# Patient Record
Sex: Male | Born: 1940
Health system: Southern US, Community
[De-identification: ages and names within clinical notes are randomized; demographics above are authoritative.]

## PROBLEM LIST (undated history)

## (undated) DIAGNOSIS — E7800 Pure hypercholesterolemia, unspecified: Secondary | ICD-10-CM

## (undated) DIAGNOSIS — K219 Gastro-esophageal reflux disease without esophagitis: Secondary | ICD-10-CM

## (undated) DIAGNOSIS — I209 Angina pectoris, unspecified: Secondary | ICD-10-CM

## (undated) DIAGNOSIS — R0602 Shortness of breath: Secondary | ICD-10-CM

## (undated) DIAGNOSIS — C61 Malignant neoplasm of prostate: Secondary | ICD-10-CM

## (undated) DIAGNOSIS — I714 Abdominal aortic aneurysm, without rupture: Secondary | ICD-10-CM

## (undated) DIAGNOSIS — I10 Essential (primary) hypertension: Secondary | ICD-10-CM

## (undated) DIAGNOSIS — I7143 Infrarenal abdominal aortic aneurysm, without rupture: Secondary | ICD-10-CM

## (undated) HISTORY — PX: PROSTATECTOMY: SHX69

## (undated) HISTORY — PX: PENILE PROSTHESIS IMPLANT: SHX240

---

## 1992-11-25 DIAGNOSIS — C61 Malignant neoplasm of prostate: Secondary | ICD-10-CM

## 1992-11-25 HISTORY — DX: Malignant neoplasm of prostate: C61

## 1997-11-25 HISTORY — PX: HERNIA REPAIR: SHX51

## 1998-11-22 ENCOUNTER — Ambulatory Visit (HOSPITAL_BASED_OUTPATIENT_CLINIC_OR_DEPARTMENT_OTHER): Admission: RE | Admit: 1998-11-22 | Discharge: 1998-11-22 | Payer: Self-pay | Admitting: *Deleted

## 2000-02-15 ENCOUNTER — Ambulatory Visit (HOSPITAL_COMMUNITY): Admission: RE | Admit: 2000-02-15 | Discharge: 2000-02-15 | Payer: Self-pay | Admitting: Urology

## 2000-02-15 ENCOUNTER — Encounter: Payer: Self-pay | Admitting: Urology

## 2000-08-20 ENCOUNTER — Encounter: Payer: Self-pay | Admitting: Urology

## 2000-08-20 ENCOUNTER — Ambulatory Visit (HOSPITAL_COMMUNITY): Admission: RE | Admit: 2000-08-20 | Discharge: 2000-08-20 | Payer: Self-pay | Admitting: Urology

## 2008-05-04 ENCOUNTER — Encounter: Admission: RE | Admit: 2008-05-04 | Discharge: 2008-05-04 | Payer: Self-pay | Admitting: Family Medicine

## 2009-04-21 ENCOUNTER — Encounter: Admission: RE | Admit: 2009-04-21 | Discharge: 2009-04-21 | Payer: Self-pay | Admitting: Family Medicine

## 2009-05-25 ENCOUNTER — Ambulatory Visit: Payer: Self-pay | Admitting: *Deleted

## 2009-09-15 ENCOUNTER — Emergency Department (HOSPITAL_COMMUNITY): Admission: EM | Admit: 2009-09-15 | Discharge: 2009-09-15 | Payer: Self-pay | Admitting: Emergency Medicine

## 2011-01-16 ENCOUNTER — Other Ambulatory Visit: Payer: Self-pay | Admitting: Family Medicine

## 2011-01-16 DIAGNOSIS — M545 Low back pain, unspecified: Secondary | ICD-10-CM

## 2011-01-22 ENCOUNTER — Ambulatory Visit
Admission: RE | Admit: 2011-01-22 | Discharge: 2011-01-22 | Disposition: A | Discharge status: Home / Self Care | Payer: Medicare Other | Source: Ambulatory Visit | Attending: Family Medicine | Admitting: Family Medicine

## 2011-01-22 DIAGNOSIS — M545 Low back pain: Secondary | ICD-10-CM

## 2011-01-29 ENCOUNTER — Other Ambulatory Visit: Payer: Self-pay | Admitting: Neurosurgery

## 2011-01-29 DIAGNOSIS — R202 Paresthesia of skin: Secondary | ICD-10-CM

## 2011-02-04 ENCOUNTER — Ambulatory Visit
Admission: RE | Admit: 2011-02-04 | Discharge: 2011-02-04 | Disposition: A | Discharge status: Home / Self Care | Payer: Medicare Other | Source: Ambulatory Visit | Attending: Neurosurgery | Admitting: Neurosurgery

## 2011-02-04 DIAGNOSIS — R202 Paresthesia of skin: Secondary | ICD-10-CM

## 2011-04-09 NOTE — Consult Note (Signed)
VASCULAR SURGERY CONSULTATION   Reimann, Kahiau R  DOB:  1941-07-29                                       05/25/2009  ZOXWR#:[id number]   REFERRAL DIAGNOSIS:  A 3 cm abdominal aortic aneurysm.   HISTORY:  The patient is a 70 year old African American male in  reasonably good health.  He underwent an abdominal ultrasound on May 28  of this year for an evaluation of elevated liver function tests.  Incidental finding was a 3 cm infrarenal abdominal aorta.  This was  interpreted as a small abdominal aortic aneurysm.   The patient has had no abdominal pain.  Denies significant back pain.   Risk factors for aneurysm disease include hypertension and male sex.   PAST MEDICAL HISTORY:  1. Hypertension.  2. Prostate CA status post prostatectomy in 1995.   MEDICATIONS:  1. Oxybutynin one tablet daily.  2. Doxazosin 1/2 tablet daily.  3. Diclofenac 1 tablet b.i.d.  4. Aspirin 325 mg daily.   ALLERGIES:  None known.   SOCIAL HISTORY:  The patient is married.  He has two grown children.  His 98 year old son has diabetes.  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  Does not smoke or use tobacco products.  He will have the  occasional alcoholic beverage.   FAMILY HISTORY:  The patient notes his mother died of cancer at age 69.  Father died at age 30 of natural causes.  He has a sister deceased age  39 of an MI.  Another brother deceased age 61 with a history of cancer.   REVIEW OF SYSTEMS:  Refer to patient encounter form.  The patient's only  complaints relate to GI reflux.  He does have some urinary frequency.  Discomfort in his legs with walking.   PHYSICAL EXAM:  A 70 year old [place] American male alert and oriented.  Clearly obese.  Height is 5 feet 4 inches, 240 pounds.  No acute  distress.  Vital signs:  BP 136/82, pulse 72 per minute, respirations 16  per minute.  HEENT:  Mouth and throat are clear.  Normocephalic.  Extraocular movements intact.   Neck:  Supple.  No thyromegaly or  adenopathy.  Chest:  Equal entry bilaterally without rales or rhonchi.  Cardiovascular:  No carotid bruits.  Normal heart sounds without  murmurs.  No gallops or rubs.  Regular rate and rhythm.  Abdomen:  Obese.  Soft and nontender.  No organomegaly or masses felt.  Normal  bowel sounds without bruits.  Lower extremities:  Full range of motion.  2+ femoral and popliteal pulses bilaterally.  Intact posterior tibial  pulses bilaterally.  2+ right dorsalis pedis, absent left dorsalis pedis  pulse.  No ankle edema.  Neurological:  Cranial nerves intact.  Strength  equal bilaterally.  1+ reflexes.  Skin:  Intact without rash or  ulceration.   IMPRESSION:  1. A 3 cm infrarenal abdominal aortic aneurysm.  2. Hypertension.  3. History of prostate cancer.   RECOMMENDATIONS:  The patient has a small abdominal aortic aneurysm.  This requires no further treatment or workup at this time.  Will plan  ultrasound at 1 year followup.   The patient instructed regarding general health measures including  controlling his weight, regular exercise and continued blood pressure  control.   Balinda Quails, M.D.  Electronically Signed  PGH/MEDQ  D:  05/25/2009  T:  05/26/2009  Job:  [id number]   cc:   Bryan Lemma. Manus Gunning, M.D.

## 2013-04-13 ENCOUNTER — Inpatient Hospital Stay (HOSPITAL_COMMUNITY): Payer: Medicare Other

## 2013-04-13 ENCOUNTER — Emergency Department (HOSPITAL_COMMUNITY): Payer: Medicare Other

## 2013-04-13 ENCOUNTER — Inpatient Hospital Stay (HOSPITAL_COMMUNITY)
Admission: EM | Admit: 2013-04-13 | Discharge: 2013-04-15 | DRG: 897 | Disposition: A | Discharge status: Home / Self Care | Payer: Medicare Other | Attending: Internal Medicine | Admitting: Internal Medicine

## 2013-04-13 ENCOUNTER — Encounter (HOSPITAL_COMMUNITY): Payer: Self-pay | Admitting: Emergency Medicine

## 2013-04-13 DIAGNOSIS — Z79899 Other long term (current) drug therapy: Secondary | ICD-10-CM

## 2013-04-13 DIAGNOSIS — K2920 Alcoholic gastritis without bleeding: Secondary | ICD-10-CM | POA: Diagnosis present

## 2013-04-13 DIAGNOSIS — E663 Overweight: Secondary | ICD-10-CM | POA: Diagnosis present

## 2013-04-13 DIAGNOSIS — K2970 Gastritis, unspecified, without bleeding: Secondary | ICD-10-CM | POA: Diagnosis present

## 2013-04-13 DIAGNOSIS — I10 Essential (primary) hypertension: Secondary | ICD-10-CM

## 2013-04-13 DIAGNOSIS — F1020 Alcohol dependence, uncomplicated: Secondary | ICD-10-CM | POA: Diagnosis present

## 2013-04-13 DIAGNOSIS — R0789 Other chest pain: Secondary | ICD-10-CM | POA: Diagnosis present

## 2013-04-13 DIAGNOSIS — K219 Gastro-esophageal reflux disease without esophagitis: Secondary | ICD-10-CM | POA: Diagnosis present

## 2013-04-13 DIAGNOSIS — Z6841 Body Mass Index (BMI) 40.0 and over, adult: Secondary | ICD-10-CM

## 2013-04-13 DIAGNOSIS — Z8546 Personal history of malignant neoplasm of prostate: Secondary | ICD-10-CM

## 2013-04-13 DIAGNOSIS — B9789 Other viral agents as the cause of diseases classified elsewhere: Secondary | ICD-10-CM | POA: Diagnosis present

## 2013-04-13 DIAGNOSIS — R000 Tachycardia, unspecified: Secondary | ICD-10-CM

## 2013-04-13 DIAGNOSIS — K279 Peptic ulcer, site unspecified, unspecified as acute or chronic, without hemorrhage or perforation: Secondary | ICD-10-CM | POA: Diagnosis present

## 2013-04-13 DIAGNOSIS — K2990 Gastroduodenitis, unspecified, without bleeding: Secondary | ICD-10-CM | POA: Diagnosis present

## 2013-04-13 DIAGNOSIS — I498 Other specified cardiac arrhythmias: Secondary | ICD-10-CM | POA: Diagnosis present

## 2013-04-13 DIAGNOSIS — F10239 Alcohol dependence with withdrawal, unspecified: Principal | ICD-10-CM | POA: Diagnosis present

## 2013-04-13 DIAGNOSIS — R079 Chest pain, unspecified: Secondary | ICD-10-CM

## 2013-04-13 DIAGNOSIS — F1010 Alcohol abuse, uncomplicated: Secondary | ICD-10-CM

## 2013-04-13 DIAGNOSIS — J029 Acute pharyngitis, unspecified: Secondary | ICD-10-CM | POA: Diagnosis present

## 2013-04-13 DIAGNOSIS — R1310 Dysphagia, unspecified: Secondary | ICD-10-CM | POA: Diagnosis present

## 2013-04-13 DIAGNOSIS — C61 Malignant neoplasm of prostate: Secondary | ICD-10-CM

## 2013-04-13 DIAGNOSIS — F10939 Alcohol use, unspecified with withdrawal, unspecified: Principal | ICD-10-CM | POA: Diagnosis present

## 2013-04-13 DIAGNOSIS — R5381 Other malaise: Secondary | ICD-10-CM | POA: Diagnosis present

## 2013-04-13 HISTORY — DX: Abdominal aortic aneurysm, without rupture: I71.4

## 2013-04-13 HISTORY — DX: Pure hypercholesterolemia, unspecified: E78.00

## 2013-04-13 HISTORY — DX: Angina pectoris, unspecified: I20.9

## 2013-04-13 HISTORY — DX: Essential (primary) hypertension: I10

## 2013-04-13 HISTORY — DX: Malignant neoplasm of prostate: C61

## 2013-04-13 HISTORY — DX: Gastro-esophageal reflux disease without esophagitis: K21.9

## 2013-04-13 HISTORY — DX: Shortness of breath: R06.02

## 2013-04-13 HISTORY — DX: Infrarenal abdominal aortic aneurysm, without rupture: I71.43

## 2013-04-13 LAB — RAPID URINE DRUG SCREEN, HOSP PERFORMED
Amphetamines: NOT DETECTED
Opiates: NOT DETECTED
Tetrahydrocannabinol: NOT DETECTED

## 2013-04-13 LAB — CBC
Platelets: 270 10*3/uL (ref 150–400)
RDW: 16.5 % — ABNORMAL HIGH (ref 11.5–15.5)
WBC: 15 10*3/uL — ABNORMAL HIGH (ref 4.0–10.5)

## 2013-04-13 LAB — COMPREHENSIVE METABOLIC PANEL
ALT: 35 U/L (ref 0–53)
AST: 56 U/L — ABNORMAL HIGH (ref 0–37)
Albumin: 4.2 g/dL (ref 3.5–5.2)
Alkaline Phosphatase: 77 U/L (ref 39–117)
Chloride: 97 mEq/L (ref 96–112)
Potassium: 4.4 mEq/L (ref 3.5–5.1)
Sodium: 141 mEq/L (ref 135–145)
Total Bilirubin: 0.5 mg/dL (ref 0.3–1.2)

## 2013-04-13 LAB — URINALYSIS, ROUTINE W REFLEX MICROSCOPIC
Glucose, UA: NEGATIVE mg/dL
Hgb urine dipstick: NEGATIVE
Ketones, ur: >80 mg/dL — AB
Protein, ur: NEGATIVE mg/dL

## 2013-04-13 LAB — ETHANOL: Alcohol, Ethyl (B): 185 mg/dL — ABNORMAL HIGH (ref 0–11)

## 2013-04-13 MED ORDER — FOLIC ACID 5 MG/ML IJ SOLN
1.0000 mg | Freq: Every day | INTRAMUSCULAR | Status: DC
  Filled 2013-04-13 (×3): qty 0.2

## 2013-04-13 MED ORDER — LABETALOL HCL 5 MG/ML IV SOLN: 10.0000 mg | INTRAVENOUS | Status: DC | PRN

## 2013-04-13 MED ORDER — VITAMIN B-1 100 MG PO TABS
100.0000 mg | ORAL_TABLET | Freq: Every day | ORAL | Status: DC
  Administered 2013-04-14 – 2013-04-15 (×2): 100 mg via ORAL
  Filled 2013-04-13 (×2): qty 1

## 2013-04-13 MED ORDER — PANTOPRAZOLE SODIUM 40 MG IV SOLR
40.0000 mg | Freq: Two times a day (BID) | INTRAVENOUS | Status: DC
  Administered 2013-04-13 – 2013-04-15 (×4): 40 mg via INTRAVENOUS
  Filled 2013-04-13 (×5): qty 40

## 2013-04-13 MED ORDER — ASPIRIN 81 MG PO CHEW
324.0000 mg | CHEWABLE_TABLET | Freq: Once | ORAL | Status: DC
  Filled 2013-04-13: qty 4

## 2013-04-13 MED ORDER — HYDRALAZINE HCL 20 MG/ML IJ SOLN
5.0000 mg | INTRAMUSCULAR | Status: DC | PRN
  Administered 2013-04-13: 5 mg via INTRAVENOUS
  Filled 2013-04-13: qty 1

## 2013-04-13 MED ORDER — LORAZEPAM 2 MG/ML IJ SOLN
1.0000 mg | Freq: Four times a day (QID) | INTRAMUSCULAR | Status: DC | PRN
  Administered 2013-04-13: 1 mg via INTRAVENOUS
  Filled 2013-04-13: qty 1

## 2013-04-13 MED ORDER — LORAZEPAM 1 MG PO TABS: 1.0000 mg | ORAL_TABLET | Freq: Four times a day (QID) | ORAL | Status: DC | PRN

## 2013-04-13 MED ORDER — LACTATED RINGERS IV BOLUS (SEPSIS)
1000.0000 mL | Freq: Once | INTRAVENOUS | Status: AC
  Administered 2013-04-13: 1000 mL via INTRAVENOUS

## 2013-04-13 MED ORDER — METOPROLOL TARTRATE 1 MG/ML IV SOLN: 10.0000 mg | Freq: Four times a day (QID) | INTRAVENOUS | Status: DC

## 2013-04-13 MED ORDER — PANTOPRAZOLE SODIUM 40 MG IV SOLR: 40.0000 mg | INTRAVENOUS | Status: DC

## 2013-04-13 MED ORDER — METOPROLOL TARTRATE 1 MG/ML IV SOLN
2.5000 mg | Freq: Four times a day (QID) | INTRAVENOUS | Status: DC
  Administered 2013-04-13 – 2013-04-14 (×3): 2.5 mg via INTRAVENOUS
  Filled 2013-04-13 (×6): qty 5

## 2013-04-13 MED ORDER — METOPROLOL TARTRATE 1 MG/ML IV SOLN: 2.5000 mg | Freq: Four times a day (QID) | INTRAVENOUS | Status: DC

## 2013-04-13 MED ORDER — HYDROMORPHONE HCL PF 1 MG/ML IJ SOLN: 0.5000 mg | INTRAMUSCULAR | Status: AC | PRN

## 2013-04-13 MED ORDER — LORAZEPAM 2 MG/ML IJ SOLN: 2.0000 mg | INTRAMUSCULAR | Status: DC | PRN

## 2013-04-13 MED ORDER — THIAMINE HCL 100 MG/ML IJ SOLN
Freq: Once | INTRAVENOUS | Status: AC
  Administered 2013-04-13: 16:00:00 via INTRAVENOUS
  Filled 2013-04-13 (×2): qty 1000

## 2013-04-13 MED ORDER — METOPROLOL TARTRATE 1 MG/ML IV SOLN
INTRAVENOUS | Status: AC
  Filled 2013-04-13: qty 5

## 2013-04-13 MED ORDER — SODIUM CHLORIDE 0.9 % IV SOLN
1000.0000 mL | Freq: Once | INTRAVENOUS | Status: AC
  Administered 2013-04-13: 1000 mL via INTRAVENOUS

## 2013-04-13 MED ORDER — HYDRALAZINE HCL 20 MG/ML IJ SOLN
10.0000 mg | INTRAMUSCULAR | Status: DC | PRN
  Administered 2013-04-13 – 2013-04-14 (×2): 10 mg via INTRAVENOUS
  Filled 2013-04-13 (×2): qty 1

## 2013-04-13 MED ORDER — LISINOPRIL 5 MG PO TABS
5.0000 mg | ORAL_TABLET | Freq: Every day | ORAL | Status: DC
  Filled 2013-04-13 (×2): qty 1

## 2013-04-13 MED ORDER — FOLIC ACID 1 MG PO TABS
1.0000 mg | ORAL_TABLET | Freq: Every day | ORAL | Status: DC
  Filled 2013-04-13: qty 1

## 2013-04-13 MED ORDER — IOHEXOL 300 MG/ML  SOLN
100.0000 mL | Freq: Once | INTRAMUSCULAR | Status: AC | PRN
  Administered 2013-04-13: 90 mL via INTRAVENOUS

## 2013-04-13 MED ORDER — GI COCKTAIL ~~LOC~~
30.0000 mL | Freq: Once | ORAL | Status: AC
  Administered 2013-04-13: 30 mL via ORAL
  Filled 2013-04-13: qty 30

## 2013-04-13 MED ORDER — OXYBUTYNIN CHLORIDE ER 10 MG PO TB24
10.0000 mg | ORAL_TABLET | Freq: Every day | ORAL | Status: DC
  Administered 2013-04-14 – 2013-04-15 (×2): 10 mg via ORAL
  Filled 2013-04-13 (×3): qty 1

## 2013-04-13 MED ORDER — LACTATED RINGERS IV SOLN: INTRAVENOUS | Status: AC

## 2013-04-13 MED ORDER — DOXAZOSIN MESYLATE 4 MG PO TABS
4.0000 mg | ORAL_TABLET | Freq: Every day | ORAL | Status: DC
  Administered 2013-04-14 – 2013-04-15 (×2): 4 mg via ORAL
  Filled 2013-04-13 (×3): qty 1

## 2013-04-13 MED ORDER — DIAZEPAM 5 MG/ML IJ SOLN: 5.0000 mg | INTRAMUSCULAR | Status: DC

## 2013-04-13 MED ORDER — SODIUM CHLORIDE 0.9 % IJ SOLN
3.0000 mL | Freq: Two times a day (BID) | INTRAMUSCULAR | Status: DC
  Administered 2013-04-14 – 2013-04-15 (×2): 3 mL via INTRAVENOUS

## 2013-04-13 MED ORDER — PANTOPRAZOLE SODIUM 40 MG PO TBEC: 40.0000 mg | DELAYED_RELEASE_TABLET | Freq: Two times a day (BID) | ORAL | Status: DC

## 2013-04-13 MED ORDER — ONDANSETRON HCL 4 MG/2ML IJ SOLN: 4.0000 mg | Freq: Three times a day (TID) | INTRAMUSCULAR | Status: AC | PRN

## 2013-04-13 MED ORDER — FOLIC ACID 1 MG PO TABS
1.0000 mg | ORAL_TABLET | Freq: Every day | ORAL | Status: DC
  Administered 2013-04-14 – 2013-04-15 (×2): 1 mg via ORAL
  Filled 2013-04-13 (×3): qty 1

## 2013-04-13 MED ORDER — ADULT MULTIVITAMIN W/MINERALS CH
1.0000 | ORAL_TABLET | Freq: Every day | ORAL | Status: DC
  Administered 2013-04-14 – 2013-04-15 (×2): 1 via ORAL
  Filled 2013-04-13 (×3): qty 1

## 2013-04-13 MED ORDER — HEPARIN SODIUM (PORCINE) 5000 UNIT/ML IJ SOLN
5000.0000 [IU] | Freq: Three times a day (TID) | INTRAMUSCULAR | Status: DC
  Administered 2013-04-13 – 2013-04-15 (×7): 5000 [IU] via SUBCUTANEOUS
  Filled 2013-04-13 (×9): qty 1

## 2013-04-13 MED ORDER — THIAMINE HCL 100 MG/ML IJ SOLN
100.0000 mg | Freq: Every day | INTRAMUSCULAR | Status: DC
  Filled 2013-04-13 (×2): qty 1

## 2013-04-13 NOTE — H&P (Addendum)
[facility] History and Physical  ARNOL MCGIBBON JYN:[id number] DOB: 12-06-1940 DOA: 04/13/2013  Referring physician: Emergency department PCP: Thora Lance, MD  Specialists:   Chief Complaint: Chest pain  HPI: Micheal Lyons is a 72 y.o. male  History of hypertension who presents with possible complaints of chest pain since yesterday. Patient has a history of alcohol abuse and states his last consumption of alcohol was on Sunday and (2 days ago). Prior to that, patient seems to have a near daily intake of hard liquor and moonshine. In the emergency department, patient was noted to have a presenting heart rate of 131. His but present blood pressure 153/96.The patient continued with active chest discomfort. Initial cardiac enzymes are negative x1. The patient did undergo a neck CT which showed some edema, otherwise was unremarkable. Of note, patient did note some improvement of his chest discomfort with a GI cocktail in the department.  Review of Systems: The patient denies anorexia, fever, weight loss,, vision loss, decreased hearing, hoarseness,syncope, dyspnea on exertion, peripheral edema, balance deficits, hemoptysis, abdominal pain, melena, hematochezia, severe indigestion/heartburn, hematuria, incontinence, genital sores, muscle weakness, suspicious skin lesions, transient blindness, difficulty walking, depression, unusual weight change, abnormal bleeding, enlarged lymph nodes, angioedema, and breast masses.   Chest pain  Past Medical History  Diagnosis Date  . Hypertension   . Cancer 1994    Prostate   Past Surgical History  Procedure Laterality Date  . Prostate surgery     Social History:  reports that he has never smoked. He uses smokeless tobacco. He reports that  drinks alcohol. He reports that he does not use illicit drugs.   No Known Allergies  History reviewed. No pertinent family history. review does noncontributory to this particular case (be sure to  complete)  Prior to Admission medications   Medication Sig Start Date End Date Taking? Authorizing Provider  doxazosin (CARDURA) 8 MG tablet Take 4 mg by mouth daily.   Yes Historical Provider, MD  lisinopril (PRINIVIL,ZESTRIL) 10 MG tablet Take 5 mg by mouth daily.   Yes Historical Provider, MD  oxybutynin (DITROPAN-XL) 10 MG 24 hr tablet Take 10 mg by mouth daily.   Yes Historical Provider, MD   Physical Exam: Filed Vitals:   04/13/13 0727 04/13/13 0730 04/13/13 0830 04/13/13 1021  BP: 167/80 170/97 173/80 195/105  Pulse: 113 113 112 115  Temp: 99.4 F (37.4 C)   98.7 F (37.1 C)  TempSrc: Oral   Oral  Resp: 20 22 0 18  Height:    5\' 4"  (1.626 m)  Weight:    109.1 kg (240 lb 8.4 oz)  SpO2: 96% 96% 96% 98%     General:  Patient is awake in no apparent distress  Eyes: Pupils are equal round reactive to light  ENT: Mucous membranes moist  Neck: Trachea midline, neck supple  Cardiovascular: Tachycardic, S1-S2  Respiratory: Normal respiratory effort, no crackles or wheezing  Abdomen: Obese, soft, positive bowel sounds  Skin: No abnormal skin lesions he  Musculoskeletal: Perfused, no clubbing or cyanosis  Psychiatric: Appears normal  Neurologic: Cranial nerves 2 through grossly intact, strength and sensation intact throughout  Labs on Admission:  Basic Metabolic Panel:  Recent Labs Lab 04/13/13 0457  NA 141  K 4.4  CL 97  CO2 15*  GLUCOSE 120*  BUN 20  CREATININE 0.98  CALCIUM 9.3   Liver Function Tests:  Recent Labs Lab 04/13/13 0457  AST 56*  ALT 35  ALKPHOS 77  BILITOT 0.5  PROT 7.6  ALBUMIN 4.2   No results found for this basename: LIPASE, AMYLASE,  in the last 168 hours No results found for this basename: AMMONIA,  in the last 168 hours CBC:  Recent Labs Lab 04/13/13 0457  WBC 15.0*  HGB 14.9  HCT 42.7  MCV 95.7  PLT 270   Cardiac Enzymes: No results found for this basename: CKTOTAL, CKMB, CKMBINDEX, TROPONINI,  in the last 168  hours  BNP (last 3 results) No results found for this basename: PROBNP,  in the last 8760 hours CBG: No results found for this basename: GLUCAP,  in the last 168 hours  Radiological Exams on Admission: Dg Chest 2 View  04/13/2013   *RADIOLOGY REPORT*  Clinical Data: Chest pain.  CHEST - 2 VIEW  Comparison: None  Findings: The cardiac silhouette, mediastinal and hilar contours are within normal limits for age.  The streaky basilar scarring changes or atelectasis but no infiltrates, edema or effusions.  The bony thorax is intact.  IMPRESSION: Streaky basilar scarring changes or atelectasis but no infiltrates, edema or effusions.   Original Report Authenticated By: Rudie Meyer, M.D.   Ct Soft Tissue Neck W Contrast  04/13/2013   *RADIOLOGY REPORT*  Clinical Data: Throat pain and fullness.  Rule out tumor  CT NECK WITH CONTRAST  Technique:  Multidetector CT imaging of the neck was performed with intravenous contrast.  Contrast: 90mL OMNIPAQUE IOHEXOL 300 MG/ML  SOLN  Comparison: None.  Findings: Mild mucosal edema in the maxillary sinuses bilaterally and in the right frontal sinus.  Lung apices are clear.  Cervical spondylosis without acute bony abnormality.  The tongue appears normal.  Para pharyngeal soft tissues are normal.  Larynx is normal and symmetric without mass lesion.  The thyroid is normal in size without focal lesion.  Parotid and submandibular glands are normal bilaterally.  No pathologic adenopathy in the neck.  There is gas in the right jugular vein from venipuncture.  IMPRESSION: No acute abnormality.  No evidence of mass or adenopathy.   Original Report Authenticated By: Janeece Riggers, M.D.    EKG: Independently reviewed. EKG demonstrates sinus tach  Assessment/Plan Active Problems:   * No active hospital problems. *   1. Acute alcohol withdrawal: Given the timeline of events, alcohol withdrawl is a likely culprit. We'll continue patient on scheduled Valium when necessary Ativan.  We'll continue patient with a banana bag monitor closely. 2. Chest pain: Strongly suspect possible esophagitis/gastritis the setting of chronic alcohol abuse. We will continue patient with pantoprazole 40 mg twice a day. A neck CT was unremarkable. Cardiac enzymes are thus far negative x1. We'll cycle additional 2 sets of cardiac enzymes to rule out MI. EKG on presentation is notable only for sinus tachycardia. 3. History of prostate cancer: We'll continue his home regimen for now 4. DVT Prophylaxis   Code Status: Full (must indicate code status--if unknown or must be presumed, indicate so) Family Communication: Patient in room (indicate person spoken with, if applicable, with phone number if by telephone) Disposition Plan: Pending (indicate anticipated LOS)  Time spent: 35 minutes  Romanita Fager K [facility] Pager [phone]  If 7PM-7AM, please contact night-coverage [email or web address] Password [facility] 04/13/2013, 11:52 AM

## 2013-04-13 NOTE — ED Notes (Signed)
Pt states he called EMS one time at 7 pm for chest pain. Pt patient states he refuses to come to the hospital. Pt states he is have sore throat. Pt states he cannot swallow. Pt denies SOB. Pt states he did have chest pain. Pt denies chest pain, SOB, Dizziness, lightheaded. Etc. Pt states he feels fine at the moment. Pt states he woke felling like he cannot swallow. Pt is coughing up mucus. Pt denies feeling congested. Pt states he has been drinking quarts of liquor all week but none today. Wife at bedside

## 2013-04-13 NOTE — ED Notes (Signed)
MD Rulon Abide

## 2013-04-13 NOTE — ED Notes (Signed)
Patient transported to X-ray 

## 2013-04-13 NOTE — Progress Notes (Signed)
Notified MD that bp has come down since prn hydralazine but still elevate. Orders received. Will closely monitor

## 2013-04-13 NOTE — ED Notes (Signed)
Returned from CT scan and taken upstairs to 3W

## 2013-04-13 NOTE — ED Notes (Signed)
Brought to ED for evaluation for chest pain. Pt called by EMS at 0100 for chest pain that pt described pain as burning and sharp and staff performed 12 lead ( no acute changes) and pt refused to go at that time. Pt called EMS back at about 0245 for same complaint and 12 lead EKG repeated with acute changes and pain described as burning and sharp. Pt also with upper airway congestion. Pt awake and alert upon arrival.

## 2013-04-13 NOTE — ED Notes (Signed)
Pt states his throat hurts. MD notified

## 2013-04-13 NOTE — ED Provider Notes (Signed)
History     CSN: [id number]  Arrival date & time 04/13/13  4098   First MD Initiated Contact with Patient 04/13/13 [phone]      Chief Complaint  Patient presents with  . Chest Pain  . Throat Pain    HPI Micheal Lyons is a 72 y.o. male history of hypertension prostate cancer who called EMS multiple times last night for chest pain. Patient says this is a dull pain in the left side of his chest this moderate to severe, it is intermittent, is not associated with shortness of breath, nausea, vomiting, diaphoresis. He's not dizzy. Patient later called EMS again with chest pain and was transported. Patient's also complaining about dysphagia and feeling like he can't swallow and neck/throat pain. Patient uses smokeless tobacco, and drinks frequently, daily liquor.  Denies any illicit drug use. Denies any difficulty breathing, throat or tongue swelling.     Past Medical History  Diagnosis Date  . Hypertension   . Cancer 1994    Prostate    Past Surgical History  Procedure Laterality Date  . Prostate surgery      History reviewed. No pertinent family history.  History  Substance Use Topics  . Smoking status: Never Smoker   . Smokeless tobacco: Current User  . Alcohol Use: Yes      Review of Systems At least 10pt or greater review of systems completed and are negative except where specified in the HPI.  Allergies  Review of patient's allergies indicates no known allergies.  Home Medications   Current Outpatient Rx  Name  Route  Sig  Dispense  Refill  . DOXAZOSIN MESYLATE PO   Oral   Take 1 tablet by mouth daily.         Marland Kitchen LISINOPRIL PO   Oral   Take 0.5 tablets by mouth daily.         Marland Kitchen PRESCRIPTION MEDICATION   Oral   Take 0.5 tablets by mouth daily.         Marland Kitchen PRESCRIPTION MEDICATION   Intramuscular   Inject 1 each into the muscle every 3 (three) months. Vitamin b12 injection           BP 142/88  Pulse 115  Temp(Src) 98.6 F (37 C) (Oral)  Resp 13   Ht 5\' 4"  (1.626 m)  Wt 250 lb (113.399 kg)  BMI 42.89 kg/m2  SpO2 97%  Physical Exam  Nursing notes reviewed.  Electronic medical record reviewed. VITAL SIGNS:   Filed Vitals:   04/13/13 0415 04/13/13 0515 04/13/13 0545 04/13/13 0727  BP: 141/85  142/88 167/80  Pulse: 122 122 115 113  Temp:    99.4 F (37.4 C)  TempSrc:    Oral  Resp: 20 21 13 20   Height:      Weight:      SpO2: 97% 95% 97% 96%   CONSTITUTIONAL: Awake, oriented, appears non-toxic HENT: Atraumatic, normocephalic, oral mucosa pink and moist, airway patent. Nares patent without drainage. External ears normal. EYES: Conjunctiva clear, EOMI, PERRLA NECK: Trachea midline, non-tender, supple CARDIOVASCULAR: Normal heart rate, Normal rhythm, No murmurs, rubs, gallops PULMONARY/CHEST: Clear to auscultation, no rhonchi, wheezes, or rales. Symmetrical breath sounds. Non-tender. ABDOMINAL: Non-distended, soft, non-tender - no rebound or guarding.  BS normal. NEUROLOGIC: Non-focal, moving all four extremities, no gross sensory or motor deficits. EXTREMITIES: No clubbing, cyanosis, or edema SKIN: Warm, Dry, No erythema, No rash  ED Course  Procedures (including critical care time)  Date: 04/13/2013  Rate:  130  Rhythm: Sinus tachycardia  QRS Axis: normal  Intervals: normal  ST/T Wave abnormalities: normal  Conduction Disutrbances: none  Narrative Interpretation: Tachycardia is new when compared with prior EKG dated 09/15/2009, nonischemic EKG   Labs Reviewed  CBC - Abnormal; Notable for the following:    WBC 15.0 (*)    RDW 16.5 (*)    All other components within normal limits  COMPREHENSIVE METABOLIC PANEL - Abnormal; Notable for the following:    CO2 15 (*)    Glucose, Bld 120 (*)    AST 56 (*)    GFR calc non Af Amer 80 (*)    All other components within normal limits  ETHANOL - Abnormal; Notable for the following:    Alcohol, Ethyl (B) 185 (*)    All other components within normal limits  URINALYSIS,  ROUTINE W REFLEX MICROSCOPIC  POCT I-STAT TROPONIN I   Dg Chest 2 View  04/13/2013   *RADIOLOGY REPORT*  Clinical Data: Chest pain.  CHEST - 2 VIEW  Comparison: None  Findings: The cardiac silhouette, mediastinal and hilar contours are within normal limits for age.  The streaky basilar scarring changes or atelectasis but no infiltrates, edema or effusions.  The bony thorax is intact.  IMPRESSION: Streaky basilar scarring changes or atelectasis but no infiltrates, edema or effusions.   Original Report Authenticated By: Rudie Meyer, M.D.     1. Chest pain   2. Tachycardia   3. Pharyngitis   4. Dysphagia   5. Alcohol abuse       MDM  Patient history smokeless tobacco use, alcohol abuse, drinking very frequently per his wife, with a history of hypertension presents with chest pain, sensation and he can't swallow/dysphagia and some neck pain. I suspicion in this patient is low for acute coronary syndrome, there's no changes on his EKG to suggest that, however the patient's neck fullness/dysphasia is concerning.  Given the patient 2 L of IV fluids and his pulse rate is not reduce that much, I do think the patient is still dehydrated likely secondary to alcohol, alcohol is 185 right now. White count is slightly elevated, there are no indications of infection. I do not see any pharyngeal erythema or exudate, no suggestions of angioedema on physical exam. Patient is indicating that he feels like he's having dysphagia just above the sternal notch, this raises the concern for possible neck neoplasm.  Discussed with Dr.Chiu for admission.        Jones Skene, MD 04/13/13 [phone]

## 2013-04-13 NOTE — ED Notes (Signed)
Pt given a cup of ice water

## 2013-04-14 DIAGNOSIS — J029 Acute pharyngitis, unspecified: Secondary | ICD-10-CM

## 2013-04-14 DIAGNOSIS — F1010 Alcohol abuse, uncomplicated: Secondary | ICD-10-CM

## 2013-04-14 DIAGNOSIS — R079 Chest pain, unspecified: Secondary | ICD-10-CM

## 2013-04-14 DIAGNOSIS — R1310 Dysphagia, unspecified: Secondary | ICD-10-CM

## 2013-04-14 DIAGNOSIS — R000 Tachycardia, unspecified: Secondary | ICD-10-CM

## 2013-04-14 DIAGNOSIS — I10 Essential (primary) hypertension: Secondary | ICD-10-CM

## 2013-04-14 DIAGNOSIS — C61 Malignant neoplasm of prostate: Secondary | ICD-10-CM

## 2013-04-14 LAB — COMPREHENSIVE METABOLIC PANEL
Albumin: 3.6 g/dL (ref 3.5–5.2)
BUN: 10 mg/dL (ref 6–23)
Calcium: 8.6 mg/dL (ref 8.4–10.5)
Chloride: 102 mEq/L (ref 96–112)
Creatinine, Ser: 0.9 mg/dL (ref 0.50–1.35)
Total Bilirubin: 0.9 mg/dL (ref 0.3–1.2)

## 2013-04-14 LAB — RAPID STREP SCREEN (MED CTR MEBANE ONLY): Streptococcus, Group A Screen (Direct): NEGATIVE

## 2013-04-14 LAB — CBC
Hemoglobin: 12.1 g/dL — ABNORMAL LOW (ref 13.0–17.0)
MCV: 96.7 fL (ref 78.0–100.0)
Platelets: 160 10*3/uL (ref 150–400)
RBC: 3.63 MIL/uL — ABNORMAL LOW (ref 4.22–5.81)
WBC: 8.2 10*3/uL (ref 4.0–10.5)

## 2013-04-14 MED ORDER — METOPROLOL TARTRATE 12.5 MG HALF TABLET
12.5000 mg | ORAL_TABLET | Freq: Two times a day (BID) | ORAL | Status: DC
  Administered 2013-04-14 – 2013-04-15 (×3): 12.5 mg via ORAL
  Filled 2013-04-14 (×4): qty 1

## 2013-04-14 MED ORDER — MENTHOL 3 MG MT LOZG
1.0000 | LOZENGE | OROMUCOSAL | Status: DC | PRN
  Filled 2013-04-14: qty 9

## 2013-04-14 MED ORDER — PHENOL 1.4 % MT LIQD
1.0000 | OROMUCOSAL | Status: DC | PRN
  Administered 2013-04-14 (×4): 1 via OROMUCOSAL
  Filled 2013-04-14 (×2): qty 177

## 2013-04-14 MED ORDER — LABETALOL HCL 5 MG/ML IV SOLN: 10.0000 mg | INTRAVENOUS | Status: DC | PRN

## 2013-04-14 MED ORDER — METOPROLOL TARTRATE 1 MG/ML IV SOLN: 2.5000 mg | Freq: Four times a day (QID) | INTRAVENOUS | Status: DC | PRN

## 2013-04-14 MED ORDER — MAGIC MOUTHWASH W/LIDOCAINE
10.0000 mL | Freq: Four times a day (QID) | ORAL | Status: DC | PRN
  Filled 2013-04-14: qty 10

## 2013-04-14 MED ORDER — KCL IN DEXTROSE-NACL 20-5-0.45 MEQ/L-%-% IV SOLN
INTRAVENOUS | Status: DC
  Administered 2013-04-14 – 2013-04-15 (×2): via INTRAVENOUS
  Filled 2013-04-14 (×3): qty 1000

## 2013-04-14 MED ORDER — LISINOPRIL 10 MG PO TABS
10.0000 mg | ORAL_TABLET | Freq: Every day | ORAL | Status: DC
  Administered 2013-04-14 – 2013-04-15 (×2): 10 mg via ORAL
  Filled 2013-04-14 (×2): qty 1

## 2013-04-14 NOTE — Progress Notes (Signed)
Patient did not eat or drink much today.  Will start gentle IVF.   -  Add magic mouthwash

## 2013-04-14 NOTE — Progress Notes (Addendum)
[facility] PROGRESS NOTE  Micheal Lyons ZOX:[id number] DOB: 01/27/1941 DOA: 04/13/2013 PCP: Micheal Lance, MD  Assessment/Plan  Acute alcohol withdrawal:   -  CIWA scores relatively low and no signs of withdrawal yet on exam. -  Continue CIWA with ativan prn  Chest pain:  Troponins neg x 3.  Resolved this morning -  Continue PPI and maalox prn  Appears to have pharyngitis with ulcerative lesions on the posterior pharynx.  Unable to drink currently due to sore throat.  Likely viral syndrome, but rule out strep throat.   -  Throat swab for strep throat -  Sore throat lozenges and spray -  Soft diet and encouraged drinking fluids today.    sinus tachycardia, likely related to alcohol withdrawal vs. Side effect of hydralazine -  Add scheduled beta blocker -  D/c prn hydralazine  HTN, uncontrolled. -  Change from prn hydralazine to prn labetalol -  Increase lisinopril -  Start oral metoprolol  History of prostate cancer: continue his home regimen  -  Continue oxybut  DVT Prophylaxis   Diet:  Dysphagia 3 Access:  PIV IVF:  KVO, but will start IVF if not drinking today Proph:  heparin  Code Status: full code Family Communication: spoke with patient and wife Disposition Plan: pending tolerating drinking liquids.     Consultants:  none  Procedures:  none  Antibiotics:  none   HPI/Subjective:  Had subjective fevers earlier.  Has had sore throat, nausea with heaves.  CP is now resolved.  Denies SOB, diarrhea.  Had normal BM overnight.  Denies changes to urinary habits.    Objective: Filed Vitals:   04/14/13 0000 04/14/13 0600 04/14/13 0604 04/14/13 0706  BP: 164/82 183/83 183/83 170/82  Pulse: 108 96 96   Temp: 98.4 F (36.9 C) 99.2 F (37.3 C)    TempSrc:      Resp: 18 18    Height:      Weight:      SpO2: 97% 95%      Intake/Output Summary (Last 24 hours) at 04/14/13 0839 Last data filed at 04/14/13 0600  Gross per 24 hour  Intake       0 ml  Output    300 ml  Net   -300 ml   Filed Weights   04/13/13 0330 04/13/13 1021  Weight: 113.399 kg (250 lb) 109.1 kg (240 lb 8.4 oz)    Exam:   General:  Overweight AAM, No acute distress  HEENT:  NCAT, MMM, OP erythema with 4mm ulcer on soft palate. No obvious exudates on tonsils.  No obvious plaques.    Cardiovascular:  RRR, nl S1, S2 no mrg, 2+ pulses, warm extremities  Respiratory:  CTAB, no increased WOB  Abdomen:   NABS, soft, NT/ND  MSK:   Normal tone and bulk, no LEE  Neuro:  Grossly intact  Data Reviewed: Basic Metabolic Panel:  Recent Labs Lab 04/13/13 0457 04/14/13 0531  NA 141 140  K 4.4 3.5  CL 97 102  CO2 15* 22  GLUCOSE 120* 112*  BUN 20 10  CREATININE 0.98 0.90  CALCIUM 9.3 8.6   Liver Function Tests:  Recent Labs Lab 04/13/13 0457 04/14/13 0531  AST 56* 33  ALT 35 20  ALKPHOS 77 57  BILITOT 0.5 0.9  PROT 7.6 6.4  ALBUMIN 4.2 3.6   No results found for this basename: LIPASE, AMYLASE,  in the last 168 hours No results found for this basename: AMMONIA,  in  the last 168 hours CBC:  Recent Labs Lab 04/13/13 0457 04/14/13 0531  WBC 15.0* 8.2  HGB 14.9 12.1*  HCT 42.7 35.1*  MCV 95.7 96.7  PLT 270 160   Cardiac Enzymes:  Recent Labs Lab 04/13/13 1453 04/13/13 2022  TROPONINI <0.30 <0.30   BNP (last 3 results) No results found for this basename: PROBNP,  in the last 8760 hours CBG: No results found for this basename: GLUCAP,  in the last 168 hours  No results found for this or any previous visit (from the past 240 hour(s)).   Studies: Dg Chest 2 View  04/13/2013   *RADIOLOGY REPORT*  Clinical Data: Chest pain.  CHEST - 2 VIEW  Comparison: None  Findings: The cardiac silhouette, mediastinal and hilar contours are within normal limits for age.  The streaky basilar scarring changes or atelectasis but no infiltrates, edema or effusions.  The bony thorax is intact.  IMPRESSION: Streaky basilar scarring changes or  atelectasis but no infiltrates, edema or effusions.   Original Report Authenticated By: Rudie Meyer, M.D.   Ct Soft Tissue Neck W Contrast  04/13/2013   *RADIOLOGY REPORT*  Clinical Data: Throat pain and fullness.  Rule out tumor  CT NECK WITH CONTRAST  Technique:  Multidetector CT imaging of the neck was performed with intravenous contrast.  Contrast: 90mL OMNIPAQUE IOHEXOL 300 MG/ML  SOLN  Comparison: None.  Findings: Mild mucosal edema in the maxillary sinuses bilaterally and in the right frontal sinus.  Lung apices are clear.  Cervical spondylosis without acute bony abnormality.  The tongue appears normal.  Para pharyngeal soft tissues are normal.  Larynx is normal and symmetric without mass lesion.  The thyroid is normal in size without focal lesion.  Parotid and submandibular glands are normal bilaterally.  No pathologic adenopathy in the neck.  There is gas in the right jugular vein from venipuncture.  IMPRESSION: No acute abnormality.  No evidence of mass or adenopathy.   Original Report Authenticated By: Janeece Riggers, M.D.    Scheduled Meds: . aspirin  324 mg Oral Once  . doxazosin  4 mg Oral Daily  . folic acid  1 mg Intravenous Daily   Or  . folic acid  1 mg Oral Daily  . heparin  5,000 Units Subcutaneous Q8H  . lisinopril  10 mg Oral Daily  . metoprolol tartrate  12.5 mg Oral BID  . multivitamin with minerals  1 tablet Oral Daily  . oxybutynin  10 mg Oral Daily  . pantoprazole (PROTONIX) IV  40 mg Intravenous Q12H  . sodium chloride  3 mL Intravenous Q12H  . thiamine  100 mg Oral Daily   Or  . thiamine  100 mg Intravenous Daily   Continuous Infusions:   Principal Problem:   Alcohol abuse Active Problems:   Prostate cancer   Essential hypertension, benign   Chest pain, unspecified    Time spent: 30 min    Micheal Lyons, [facility]  [facility] Pager [phone]. If 7PM-7AM, please contact night-coverage at [email or web address], password [facility] 04/14/2013, 8:39 AM  LOS: 1 day

## 2013-04-15 ENCOUNTER — Inpatient Hospital Stay (HOSPITAL_COMMUNITY): Payer: Medicare Other

## 2013-04-15 LAB — D-DIMER, QUANTITATIVE: D-Dimer, Quant: 0.78 ug/mL-FEU — ABNORMAL HIGH (ref 0.00–0.48)

## 2013-04-15 MED ORDER — LISINOPRIL 10 MG PO TABS: 10.0000 mg | ORAL_TABLET | Freq: Every day | ORAL | Status: AC

## 2013-04-15 MED ORDER — PHENOL 1.4 % MT LIQD: 1.0000 | OROMUCOSAL | Status: AC | PRN

## 2013-04-15 MED ORDER — IOHEXOL 350 MG/ML SOLN
100.0000 mL | Freq: Once | INTRAVENOUS | Status: AC | PRN
  Administered 2013-04-15: 100 mL via INTRAVENOUS

## 2013-04-15 MED ORDER — METOPROLOL TARTRATE 25 MG PO TABS: 25.0000 mg | ORAL_TABLET | Freq: Two times a day (BID) | ORAL | Status: AC

## 2013-04-15 MED ORDER — PANTOPRAZOLE SODIUM 40 MG PO TBEC: 40.0000 mg | DELAYED_RELEASE_TABLET | Freq: Two times a day (BID) | ORAL | Status: DC

## 2013-04-15 NOTE — Discharge Summary (Signed)
Physician Discharge Summary  Micheal Lyons OZH:[id number] DOB: 17-Jun-1941 DOA: 04/13/2013  PCP: Thora Lance, MD  Admit date: 04/13/2013 Discharge date: 04/15/2013  Recommendations for Outpatient Follow-up:  1. Primary care doctor on Monday.  Please follow up on TSH which is pending at the time of discharge.  Consider repeat BMP as patient received IV contrast on 5/22.    Discharge Diagnoses:  Principal Problem:   Alcohol abuse Active Problems:   Prostate cancer   Essential hypertension, benign   Chest pain, unspecified   Pharyngitis   Discharge Condition: stable, improved  Diet recommendation: dysphagia 3  Wt Readings from Last 3 Encounters:  04/13/13 109.1 kg (240 lb 8.4 oz)    History of present illness:   Micheal Lyons is a 72 y.o. male  History of hypertension who presents with possible complaints of chest pain since yesterday. Patient has a history of alcohol abuse and states his last consumption of alcohol was on Sunday and (2 days ago). Prior to that, patient seems to have a near daily intake of hard liquor and moonshine. In the emergency department, patient was noted to have a presenting heart rate of 131. His but present blood pressure 153/96.The patient continued with active chest discomfort. Initial cardiac enzymes are negative x1. The patient did undergo a neck CT which showed some edema, otherwise was unremarkable. Of note, patient did note some improvement of his chest discomfort with a GI cocktail in the department.   Hospital Course:   Acute alcohol withdrawal:  CIWA scores 0 over last 24 hours.  No evidence of withdrawal on exam.  Chest pain: Troponins neg x 3. Resolved spontaneously.  Telemetry demonstrated sinus rhythm.  Most likely his chest pain was due to gastritis, PUD, or GERD.  He should continue PPI and maalox prn.   Appears to have pharyngitis with ulcerative lesions on the posterior pharynx.  Rapid strep test was negative, culture is  pending.  He was started on throat spray which helped and he was able to eat and drink normally prior ot discharge.  Likely related to viral syndrome and patient denies ingestion of caustic materials.  Should continue to improve, but if not, patient advised to talk to his primary care doctor for further testing.    Sinus tachycardia, likely related to alcohol withdrawal vs. Side effect of hydralazine which was started for blood pressure control initially.  His hydralazine was discontinued and he was started on metoprolol.  He was hydrated and denied orthostatic symptoms at the time of discharge.  His heart rate did escalate with activity which may be due to deconditioning.  Will continue beta blocker at discharge and he should have HR and BP check in 1 week by PCP.  TSH is pending and D-dimer was mildly elevated at 780 (above threshold of 720 given patient's age), however, CTa was negative.  Patient was advised to stay hydrated this weekend and have repeat blood work on Monday.    HTN, uncontrolled.  Increased lisinopril and started oral metoprolol.  History of prostate cancer: continue his home regimen.  continued oxybut.  Consultants:  none Procedures:  none Antibiotics:  none   Discharge Exam: Filed Vitals:   04/15/13 1549  BP: 143/77  Pulse: 103  Temp: 99.4 F (37.4 C)  Resp: 20   Filed Vitals:   04/15/13 0529 04/15/13 0802 04/15/13 1007 04/15/13 1549  BP: 160/77 164/79 133/83 143/77  Pulse: 89 104 105 103  Temp: 98.8 F (37.1 C) 99.2 F (37.3  C)  99.4 F (37.4 C)  TempSrc: Oral     Resp: 18 18  20   Height:      Weight:      SpO2: 98% 100%  100%   Ate breakfast.  Throat feels better.    General: Overweight AAM, No acute distress  HEENT: NCAT, MMM, less OP erythema with stable 4mm ulcer on soft palate. No obvious exudates on tonsils. No obvious plaques.  Cardiovascular: RRR, nl S1, S2 no mrg, 2+ pulses, warm extremities  Respiratory: CTAB, no increased WOB  Abdomen:  NABS, soft, NT/ND  MSK: Normal tone and bulk, no LEE  Neuro: Grossly intact   Discharge Instructions      Discharge Orders   Future Orders Complete By Expires     Call MD for:  difficulty breathing, headache or visual disturbances  As directed     Call MD for:  extreme fatigue  As directed     Call MD for:  hives  As directed     Call MD for:  persistant dizziness or light-headedness  As directed     Call MD for:  persistant nausea and vomiting  As directed     Call MD for:  severe uncontrolled pain  As directed     Call MD for:  temperature >100.4  As directed     Diet - low sodium heart healthy  As directed     Discharge instructions  As directed     Comments:      You were hospitalized with nausea, vomiting, sore throat, and fast heart rate.  You may have had some mild symptoms from not drinking alcohol or you may have had a viral syndrome.  You may continue your throat spray for your sore throat.  You did not have a blood clot in the lungs.  Please make sure you stay hydrated and do some activities during the day which may help your heart rate.  You were also started on a medication metoprolol which may slow your heart rate some.  Your primary care doctor should reassess you on Monday.  You have a pending blood test, a thyroid test, which they should follow up on.    Increase activity slowly  As directed         Medication List    STOP taking these medications       PRESCRIPTION MEDICATION      TAKE these medications       aspirin EC 81 MG tablet  Take 81 mg by mouth daily.     doxazosin 8 MG tablet  Commonly known as:  CARDURA  Take 4 mg by mouth daily.     lisinopril 10 MG tablet  Commonly known as:  PRINIVIL,ZESTRIL  Take 1 tablet (10 mg total) by mouth daily.     metoprolol tartrate 25 MG tablet  Commonly known as:  LOPRESSOR  Take 1 tablet (25 mg total) by mouth 2 (two) times daily.     multivitamin with minerals tablet  Take 1 tablet by mouth daily.      oxybutynin 10 MG 24 hr tablet  Commonly known as:  DITROPAN-XL  Take 10 mg by mouth daily.     pantoprazole 40 MG tablet  Commonly known as:  PROTONIX  Take 1 tablet (40 mg total) by mouth 2 (two) times daily.     phenol 1.4 % Liqd  Commonly known as:  CHLORASEPTIC  Use as directed 1 spray in the mouth or throat  as needed.     VITAMIN B-12 IJ  Inject 1 each into the muscle every 3 (three) months. 1 injection IM q29months       Follow-up Information   Follow up with Thora Lance, MD. Schedule an appointment as soon as possible for a visit in 4 days.   Contact information:   [address] [place] [address] [phone]        The results of significant diagnostics from this hospitalization (including imaging, microbiology, ancillary and laboratory) are listed below for reference.    Significant Diagnostic Studies: Dg Chest 2 View  04/13/2013   *RADIOLOGY REPORT*  Clinical Data: Chest pain.  CHEST - 2 VIEW  Comparison: None  Findings: The cardiac silhouette, mediastinal and hilar contours are within normal limits for age.  The streaky basilar scarring changes or atelectasis but no infiltrates, edema or effusions.  The bony thorax is intact.  IMPRESSION: Streaky basilar scarring changes or atelectasis but no infiltrates, edema or effusions.   Original Report Authenticated By: Rudie Meyer, M.D.   Ct Soft Tissue Neck W Contrast  04/13/2013   *RADIOLOGY REPORT*  Clinical Data: Throat pain and fullness.  Rule out tumor  CT NECK WITH CONTRAST  Technique:  Multidetector CT imaging of the neck was performed with intravenous contrast.  Contrast: 90mL OMNIPAQUE IOHEXOL 300 MG/ML  SOLN  Comparison: None.  Findings: Mild mucosal edema in the maxillary sinuses bilaterally and in the right frontal sinus.  Lung apices are clear.  Cervical spondylosis without acute bony abnormality.  The tongue appears normal.  Para pharyngeal soft tissues are normal.  Larynx is normal and symmetric  without mass lesion.  The thyroid is normal in size without focal lesion.  Parotid and submandibular glands are normal bilaterally.  No pathologic adenopathy in the neck.  There is gas in the right jugular vein from venipuncture.  IMPRESSION: No acute abnormality.  No evidence of mass or adenopathy.   Original Report Authenticated By: Janeece Riggers, M.D.    Microbiology: Recent Results (from the past 240 hour(s))  RAPID STREP SCREEN     Status: None   Collection Time    04/14/13 10:36 AM      Result Value Range Status   Streptococcus, Group A Screen (Direct) NEGATIVE  NEGATIVE Final   Comment: (NOTE)     A Rapid Antigen test may result negative if the antigen level in the     sample is below the detection level of this test. The FDA has not     cleared this test as a stand-alone test therefore the rapid antigen     negative result has reflexed to a Group A Strep culture.  CULTURE, GROUP A STREP     Status: None   Collection Time    04/14/13 10:36 AM      Result Value Range Status   Specimen Description THROAT   Final   Special Requests NONE   Final   Culture NO SUSPICIOUS COLONIES, CONTINUING TO HOLD   Final   Report Status PENDING   Incomplete     Labs: Basic Metabolic Panel:  Recent Labs Lab 04/13/13 0457 04/14/13 0531  NA 141 140  K 4.4 3.5  CL 97 102  CO2 15* 22  GLUCOSE 120* 112*  BUN 20 10  CREATININE 0.98 0.90  CALCIUM 9.3 8.6   Liver Function Tests:  Recent Labs Lab 04/13/13 0457 04/14/13 0531  AST 56* 33  ALT 35 20  ALKPHOS 77 57  BILITOT 0.5 0.9  PROT 7.6 6.4  ALBUMIN 4.2 3.6   No results found for this basename: LIPASE, AMYLASE,  in the last 168 hours No results found for this basename: AMMONIA,  in the last 168 hours CBC:  Recent Labs Lab 04/13/13 0457 04/14/13 0531  WBC 15.0* 8.2  HGB 14.9 12.1*  HCT 42.7 35.1*  MCV 95.7 96.7  PLT 270 160   Cardiac Enzymes:  Recent Labs Lab 04/13/13 1453 04/13/13 2022  TROPONINI <0.30 <0.30    BNP: BNP (last 3 results) No results found for this basename: PROBNP,  in the last 8760 hours CBG: No results found for this basename: GLUCAP,  in the last 168 hours  Time coordinating discharge: 45 minutes  Signed:  Glen Kesinger  [facility] 04/15/2013, 4:44 PM

## 2013-04-15 NOTE — Progress Notes (Signed)
Pt discharged to home per MD order. Pt and wife received and reviewed all discharge instructions and medication information including follow-up appointments and prescriptions. Pt and wife verbalized understanding. Pt alert and oriented at discharge with no complaints of pain. Pt escorted to private vehicle via wheelchair by nurse tech. Efraim Kaufmann

## 2013-04-16 LAB — CULTURE, GROUP A STREP

## 2014-05-05 ENCOUNTER — Ambulatory Visit
Admission: RE | Admit: 2014-05-05 | Discharge: 2014-05-05 | Disposition: A | Discharge status: Home / Self Care | Payer: Commercial Managed Care - HMO | Source: Ambulatory Visit | Attending: Family Medicine | Admitting: Family Medicine

## 2014-05-05 ENCOUNTER — Other Ambulatory Visit: Payer: Self-pay | Admitting: Family Medicine

## 2014-05-05 DIAGNOSIS — R0600 Dyspnea, unspecified: Secondary | ICD-10-CM

## 2014-05-12 ENCOUNTER — Other Ambulatory Visit (HOSPITAL_COMMUNITY): Payer: Commercial Managed Care - HMO

## 2014-05-31 ENCOUNTER — Other Ambulatory Visit (HOSPITAL_COMMUNITY): Payer: Self-pay | Admitting: Family Medicine

## 2014-05-31 ENCOUNTER — Ambulatory Visit (HOSPITAL_COMMUNITY): Payer: Medicare HMO | Attending: Family Medicine | Admitting: Radiology

## 2014-05-31 DIAGNOSIS — R079 Chest pain, unspecified: Secondary | ICD-10-CM

## 2014-05-31 DIAGNOSIS — R0602 Shortness of breath: Secondary | ICD-10-CM | POA: Insufficient documentation

## 2014-05-31 DIAGNOSIS — I517 Cardiomegaly: Secondary | ICD-10-CM | POA: Insufficient documentation

## 2014-05-31 DIAGNOSIS — F1010 Alcohol abuse, uncomplicated: Secondary | ICD-10-CM | POA: Insufficient documentation

## 2014-05-31 DIAGNOSIS — R55 Syncope and collapse: Secondary | ICD-10-CM

## 2014-05-31 DIAGNOSIS — R0609 Other forms of dyspnea: Secondary | ICD-10-CM | POA: Insufficient documentation

## 2014-05-31 DIAGNOSIS — R0989 Other specified symptoms and signs involving the circulatory and respiratory systems: Secondary | ICD-10-CM | POA: Insufficient documentation

## 2014-05-31 DIAGNOSIS — R5381 Other malaise: Secondary | ICD-10-CM | POA: Insufficient documentation

## 2014-05-31 DIAGNOSIS — R5383 Other fatigue: Secondary | ICD-10-CM

## 2014-05-31 DIAGNOSIS — R072 Precordial pain: Secondary | ICD-10-CM | POA: Insufficient documentation

## 2014-05-31 DIAGNOSIS — I10 Essential (primary) hypertension: Secondary | ICD-10-CM | POA: Insufficient documentation

## 2014-05-31 NOTE — Progress Notes (Signed)
Echocardiogram performed.  

## 2014-07-18 ENCOUNTER — Telehealth: Payer: Self-pay | Admitting: Internal Medicine

## 2014-07-18 ENCOUNTER — Ambulatory Visit (HOSPITAL_BASED_OUTPATIENT_CLINIC_OR_DEPARTMENT_OTHER): Payer: Commercial Managed Care - HMO

## 2014-07-18 ENCOUNTER — Other Ambulatory Visit: Payer: Commercial Managed Care - HMO

## 2014-07-18 ENCOUNTER — Ambulatory Visit (HOSPITAL_BASED_OUTPATIENT_CLINIC_OR_DEPARTMENT_OTHER): Payer: Commercial Managed Care - HMO | Admitting: Internal Medicine

## 2014-07-18 VITALS — BP 135/65 | HR 68 | Temp 98.6°F | Resp 19 | Ht 64.0 in | Wt 247.5 lb

## 2014-07-18 DIAGNOSIS — I10 Essential (primary) hypertension: Secondary | ICD-10-CM

## 2014-07-18 DIAGNOSIS — Z803 Family history of malignant neoplasm of breast: Secondary | ICD-10-CM

## 2014-07-18 DIAGNOSIS — D539 Nutritional anemia, unspecified: Secondary | ICD-10-CM | POA: Insufficient documentation

## 2014-07-18 DIAGNOSIS — D649 Anemia, unspecified: Secondary | ICD-10-CM

## 2014-07-18 DIAGNOSIS — Z8546 Personal history of malignant neoplasm of prostate: Secondary | ICD-10-CM

## 2014-07-18 DIAGNOSIS — Z809 Family history of malignant neoplasm, unspecified: Secondary | ICD-10-CM

## 2014-07-18 DIAGNOSIS — R5383 Other fatigue: Secondary | ICD-10-CM

## 2014-07-18 DIAGNOSIS — R5381 Other malaise: Secondary | ICD-10-CM

## 2014-07-18 LAB — CBC & DIFF AND RETIC
BASO%: 0.2 % (ref 0.0–2.0)
BASOS ABS: 0 10*3/uL (ref 0.0–0.1)
EOS%: 3.2 % (ref 0.0–7.0)
Eosinophils Absolute: 0.2 10*3/uL (ref 0.0–0.5)
HEMATOCRIT: 35.5 % — AB (ref 38.4–49.9)
HEMOGLOBIN: 11.5 g/dL — AB (ref 13.0–17.1)
IMMATURE RETIC FRACT: 14.7 % — AB (ref 3.00–10.60)
LYMPH#: 1.8 10*3/uL (ref 0.9–3.3)
LYMPH%: 31.3 % (ref 14.0–49.0)
MCH: 32.3 pg (ref 27.2–33.4)
MCHC: 32.4 g/dL (ref 32.0–36.0)
MCV: 99.7 fL — ABNORMAL HIGH (ref 79.3–98.0)
MONO#: 0.3 10*3/uL (ref 0.1–0.9)
MONO%: 4.8 % (ref 0.0–14.0)
NEUT#: 3.4 10*3/uL (ref 1.5–6.5)
NEUT%: 60.5 % (ref 39.0–75.0)
Platelets: 243 10*3/uL (ref 140–400)
RBC: 3.56 10*6/uL — ABNORMAL LOW (ref 4.20–5.82)
RDW: 15.9 % — AB (ref 11.0–14.6)
RETIC CT ABS: 95.05 10*3/uL — AB (ref 34.80–93.90)
Retic %: 2.67 % — ABNORMAL HIGH (ref 0.80–1.80)
WBC: 5.6 10*3/uL (ref 4.0–10.3)

## 2014-07-18 LAB — IRON AND TIBC CHCC
%SAT: 17 % — ABNORMAL LOW (ref 20–55)
Iron: 42 ug/dL (ref 42–163)
TIBC: 255 ug/dL (ref 202–409)
UIBC: 213 ug/dL (ref 117–376)

## 2014-07-18 LAB — FERRITIN CHCC: FERRITIN: 168 ng/mL (ref 22–316)

## 2014-07-18 NOTE — Progress Notes (Signed)
[place] Telephone:([phone]   Fax:(336) [phone]  CONSULT NOTE  REFERRING PHYSICIAN: Dr. Gaynelle Arabian  REASON FOR CONSULTATION:  73 years old African American male with persistent anemia  HPI Micheal Lyons is a 73 y.o. male with past medical history significant for history of prostate cancer status post radiotherapy under the care of Dr. Valere Dross followed by prostatectomy and hormonal treatment in 1995, history of hypertension as well as urinary incontinence followed by Dr. Janice Norrie. The patient was seen recently by his primary care physician in April 2015 and CBC performed on 03/03/2014 showed hemoglobin of 12.4 and hematocrit 37.6% with normal white blood count and platelets count. Repeat CBC on 05/05/2014 showed further drop in his hemoglobin to 11.0 and hematocrit 34.2%.  He was referred to Dr. Frances Furbish and underwent upper endoscopy and colonoscopy on 07/05/2014 that were unremarkable. The patient was referred to me today for further evaluation and recommendation regarding his persistent anemia. When seen today he denied having any specific complaints except for mild fatigue. He denied having any significant dizzy spells. The patient denied having any bleeding, bruises or ecchymosis. He has no chest pain, shortness breath, cough or hemoptysis. He denied having any headache or blurry vision. Family history significant for a mother who died at age 78 with cancer, sister had breast cancer and father died at age 84 after a fall.  The patient is married and was accompanied by his wife Micheal Lyons. He is to work as a Administrator and has no history of smoking but he chews tobacco. He also drinks a fifth of alcohol every week and no history of drug abuse. HPI  Past Medical History  Diagnosis Date  . Hypertension   . High cholesterol   . Anginal pain     "that's why I'm here" (04/13/2013)  . Exertional shortness of breath     "sometimes" (04/13/2013)  . GERD  (gastroesophageal reflux disease)   . Prostate cancer 1994  . Aneurysm of infrarenal abdominal aorta     Micheal Lyons 05/25/2009 (04/13/2013)    Past Surgical History  Procedure Laterality Date  . Prostatectomy  ~ 1994  . Penile prosthesis implant  ? 1999  . Hernia repair  1999    "in my stomach" (04/13/2013)    No family history on file.  Social History History  Substance Use Topics  . Smoking status: Never Smoker   . Smokeless tobacco: Current User    Types: Chew  . Alcohol Use: 33.6 oz/week    56 Shots of liquor per week     Comment: 04/13/2013  1/2 of 1/5th bottle of brandy usually, sometimes whiskey/day"    No Known Allergies  Current Outpatient Prescriptions  Medication Sig Dispense Refill  . aspirin EC 81 MG tablet Take 81 mg by mouth daily.      . Calcium Carbonate-Vitamin D (CALCIUM 600+D) 600-400 MG-UNIT per tablet 2 tablet with food      . Cyanocobalamin (VITAMIN B-12 IJ) Inject 1 each into the muscle every 3 (three) months. 1 injection IM q76month      . doxazosin (CARDURA) 8 MG tablet Take 4 mg by mouth daily.      .Marland Kitchenlisinopril (PRINIVIL,ZESTRIL) 10 MG tablet Take 1 tablet (10 mg total) by mouth daily.  30 tablet  0  . metoprolol tartrate (LOPRESSOR) 25 MG tablet Take 1 tablet (25 mg total) by mouth 2 (two) times daily.  60 tablet  0  . omeprazole (PRILOSEC) 20 MG capsule       .  oxybutynin (DITROPAN-XL) 10 MG 24 hr tablet Take 10 mg by mouth daily.      . Multiple Vitamins-Minerals (MULTIVITAMIN WITH MINERALS) tablet Take 1 tablet by mouth daily.      . phenol (CHLORASEPTIC) 1.4 % LIQD Use as directed 1 spray in the mouth or throat as needed.    0   No current facility-administered medications for this visit.    Review of Systems  Constitutional: positive for fatigue Eyes: negative Ears, nose, mouth, throat, and face: negative Respiratory: negative Cardiovascular: negative Gastrointestinal: negative Genitourinary:negative Integument/breast:  negative Hematologic/lymphatic: negative Musculoskeletal:negative Neurological: negative Behavioral/Psych: negative Endocrine: negative Allergic/Immunologic: negative  Physical Exam  UEK:CMKLK, healthy, no distress, well nourished and well developed SKIN: skin color, texture, turgor are normal, no rashes or significant lesions HEAD: Normocephalic, No masses, lesions, tenderness or abnormalities EYES: normal, PERRLA EARS: External ears normal, Canals clear OROPHARYNX:no exudate, no erythema and lips, buccal mucosa, and tongue normal  NECK: supple, no adenopathy, no JVD LYMPH:  no palpable lymphadenopathy, no hepatosplenomegaly LUNGS: clear to auscultation , and palpation HEART: regular rate & rhythm, no murmurs and no gallops ABDOMEN:abdomen soft, non-tender, obese, normal bowel sounds and no masses or organomegaly BACK: Back symmetric, no curvature., No CVA tenderness EXTREMITIES:no joint deformities, effusion, or inflammation, no edema, no skin discoloration  NEURO: alert & oriented x 3 with fluent speech, no focal motor/sensory deficits  PERFORMANCE STATUS: ECOG 1  LABORATORY DATA: Lab Results  Component Value Date   WBC 8.2 04/14/2013   HGB 12.1* 04/14/2013   HCT 35.1* 04/14/2013   MCV 96.7 04/14/2013   PLT 160 04/14/2013      Chemistry      Component Value Date/Time   NA 140 04/14/2013 0531   K 3.5 04/14/2013 0531   CL 102 04/14/2013 0531   CO2 22 04/14/2013 0531   BUN 10 04/14/2013 0531   CREATININE 0.90 04/14/2013 0531      Component Value Date/Time   CALCIUM 8.6 04/14/2013 0531   ALKPHOS 57 04/14/2013 0531   AST 33 04/14/2013 0531   ALT 20 04/14/2013 0531   BILITOT 0.9 04/14/2013 0531       RADIOGRAPHIC STUDIES: No results found.  ASSESSMENT AND PLAN: 1) persistent anemia: Unclear etiology at this point but this could be secondary to nutritional deficiency versus anemia of chronic disease or myelodysplastic abnormalities. However several studies today to evaluate  his anemia including repeat CBC, reticulocyte count, comprehensive metabolic panel, iron study, ferritin, serum folate, serum vitamin B 12 level, serum protein electrophoresis as well as serum erythropoietin. If no clear etiology for his anemia I may consider the patient for a bone marrow biopsy and aspirate especially if he continues to have a declining hemoglobin and hematocrit. I will see the patient back for followup visit in 3 weeks for evaluation and discussion of his lab results and further recommendation regarding treatment of his condition. I gave the patient and his wife the time to ask questions and answers and completed to their satisfaction.  2) history of prostate cancer diagnosed in 1995: Status post curative radiotherapy followed by prostatectomy as well as hormonal therapy. Currently on observation and followed by Dr. Janice Norrie.   3) hypertension: Managed by his primary care physician.  The patient voices understanding of current disease status and treatment options and is in agreement with the current care plan.  All questions were answered. The patient knows to call the clinic with any problems, questions or concerns. We can certainly see the patient much  sooner if necessary.  Thank you so much for allowing me to participate in the care of Micheal Lyons. I will continue to follow up the patient with you and assist in his care.  I spent 30 minutes counseling the patient face to face. The total time spent in the appointment was 55 minutes.  Disclaimer: This note was dictated with voice recognition software. Similar sounding words can inadvertently be transcribed and may not be corrected upon review.   MOHAMED,MOHAMED K. 07/18/2014, 12:06 PM

## 2014-07-18 NOTE — Telephone Encounter (Signed)
C/D 07/18/14 for appt. 07/18/14

## 2014-07-21 LAB — SPEP & IFE WITH QIG
Albumin ELP: 56.6 % (ref 55.8–66.1)
Alpha-1-Globulin: 5.2 % — ABNORMAL HIGH (ref 2.9–4.9)
Alpha-2-Globulin: 11.8 % (ref 7.1–11.8)
BETA 2: 7.6 % — AB (ref 3.2–6.5)
Beta Globulin: 6 % (ref 4.7–7.2)
GAMMA GLOBULIN: 12.8 % (ref 11.1–18.8)
IGA: 344 mg/dL (ref 68–379)
IGM, SERUM: 29 mg/dL — AB (ref 41–251)
IgG (Immunoglobin G), Serum: 843 mg/dL (ref 650–1600)
Total Protein, Serum Electrophoresis: 6.3 g/dL (ref 6.0–8.3)

## 2014-07-21 LAB — FOLATE: FOLATE: 5.6 ng/mL

## 2014-07-21 LAB — ERYTHROPOIETIN: Erythropoietin: 36.4 m[IU]/mL — ABNORMAL HIGH (ref 2.6–18.5)

## 2014-07-21 LAB — VITAMIN B12: VITAMIN B 12: 407 pg/mL (ref 211–911)

## 2014-08-09 ENCOUNTER — Encounter: Payer: Self-pay | Admitting: Internal Medicine

## 2014-08-09 ENCOUNTER — Ambulatory Visit (HOSPITAL_BASED_OUTPATIENT_CLINIC_OR_DEPARTMENT_OTHER): Payer: Commercial Managed Care - HMO | Admitting: Internal Medicine

## 2014-08-09 ENCOUNTER — Telehealth: Payer: Self-pay | Admitting: Internal Medicine

## 2014-08-09 VITALS — BP 144/68 | HR 97 | Temp 97.0°F | Resp 19 | Ht 64.0 in | Wt 231.2 lb

## 2014-08-09 DIAGNOSIS — D539 Nutritional anemia, unspecified: Secondary | ICD-10-CM

## 2014-08-09 DIAGNOSIS — D649 Anemia, unspecified: Secondary | ICD-10-CM

## 2014-08-09 NOTE — Progress Notes (Signed)
[place] Telephone:([phone]   Fax:(336) [phone]  OFFICE PROGRESS NOTE  Simona Huh, MD [address] [place] [address]  DIAGNOSIS: Persistent anemia questionable for anemia of chronic disease  PRIOR THERAPY: None.  CURRENT THERAPY: None.  INTERVAL HISTORY: Micheal Lyons 73 y.o. male returns to the clinic today for followup visit accompanied by his wife. The patient is feeling fine today with no specific complaints except for mild fatigue. He denied having any significant bleeding issues. He has no chest pain, shortness breath, cough or hemoptysis. The patient denied having any significant weight loss or night sweats. He was evaluated recently for persistent anemia and I ordered several studies that were unremarkable. The studies included iron study, ferritin, serum folate, serum vitamin B 12 level, serum erythropoietin level, serum protein electrophoreses. The patient is here today for evaluation and discussion of his lab results.  MEDICAL HISTORY: Past Medical History  Diagnosis Date  . Hypertension   . High cholesterol   . Anginal pain     "that's why I'm here" (04/13/2013)  . Exertional shortness of breath     "sometimes" (04/13/2013)  . GERD (gastroesophageal reflux disease)   . Prostate cancer 1994  . Aneurysm of infrarenal abdominal aorta     Archie Endo 05/25/2009 (04/13/2013)    ALLERGIES:  has No Known Allergies.  MEDICATIONS:  Current Outpatient Prescriptions  Medication Sig Dispense Refill  . aspirin EC 81 MG tablet Take 81 mg by mouth daily.      . Calcium Carbonate-Vitamin D (CALCIUM 600+D) 600-400 MG-UNIT per tablet 2 tablet with food      . Cyanocobalamin (VITAMIN B-12 IJ) Inject 1 each into the muscle every 3 (three) months. 1 injection IM q47months      . doxazosin (CARDURA) 8 MG tablet Take 4 mg by mouth daily.      Marland Kitchen lisinopril (PRINIVIL,ZESTRIL) 10 MG tablet Take 1 tablet (10 mg total) by mouth daily.  30  tablet  0  . metoprolol tartrate (LOPRESSOR) 25 MG tablet Take 1 tablet (25 mg total) by mouth 2 (two) times daily.  60 tablet  0  . Multiple Vitamins-Minerals (MULTIVITAMIN WITH MINERALS) tablet Take 1 tablet by mouth daily.      Marland Kitchen omeprazole (PRILOSEC) 20 MG capsule       . oxybutynin (DITROPAN-XL) 10 MG 24 hr tablet Take 10 mg by mouth daily.      . phenol (CHLORASEPTIC) 1.4 % LIQD Use as directed 1 spray in the mouth or throat as needed.    0   No current facility-administered medications for this visit.    SURGICAL HISTORY:  Past Surgical History  Procedure Laterality Date  . Prostatectomy  ~ 1994  . Penile prosthesis implant  ? 1999  . Hernia repair  1999    "in my stomach" (04/13/2013)    REVIEW OF SYSTEMS:  A comprehensive review of systems was negative except for: Constitutional: positive for fatigue   PHYSICAL EXAMINATION: General appearance: alert, cooperative and fatigued Head: Normocephalic, without obvious abnormality, atraumatic Neck: no adenopathy, no JVD, supple, symmetrical, trachea midline and thyroid not enlarged, symmetric, no tenderness/mass/nodules Lymph nodes: Cervical, supraclavicular, and axillary nodes normal. Resp: clear to auscultation bilaterally Back: symmetric, no curvature. ROM normal. No CVA tenderness. Cardio: regular rate and rhythm, S1, S2 normal, no murmur, click, rub or gallop GI: soft, non-tender; bowel sounds normal; no masses,  no organomegaly Extremities: extremities normal, atraumatic, no cyanosis or edema  ECOG PERFORMANCE STATUS: 1 - Symptomatic but completely ambulatory  Blood pressure 144/68, pulse 109, temperature 99 F (37.2 C), resp. rate 19, height $RemoveBe'5\' 4"'lOLaZVEHL$  (1.626 m), weight 231 lb 3.2 oz (104.872 kg), SpO2 97.00%.  LABORATORY DATA: Lab Results  Component Value Date   WBC 5.6 07/18/2014   HGB 11.5* 07/18/2014   HCT 35.5* 07/18/2014   MCV 99.7* 07/18/2014   PLT 243 07/18/2014      Chemistry      Component Value Date/Time   NA  140 04/14/2013 0531   K 3.5 04/14/2013 0531   CL 102 04/14/2013 0531   CO2 22 04/14/2013 0531   BUN 10 04/14/2013 0531   CREATININE 0.90 04/14/2013 0531      Component Value Date/Time   CALCIUM 8.6 04/14/2013 0531   ALKPHOS 57 04/14/2013 0531   AST 33 04/14/2013 0531   ALT 20 04/14/2013 0531   BILITOT 0.9 04/14/2013 0531       RADIOGRAPHIC STUDIES: No results found.  ASSESSMENT AND PLAN: this is a very pleasant 73 years old African American male with persistent anemia of unclear etiology but most likely anemia of chronic disease. His recent blood work showed no significant nutritional deficiency leading to his anemia. I discussed the lab result with the patient and his wife. I discussed with him proceeding with a bone marrow biopsy and aspirate to rule out any myelodysplastic syndrome or other infiltrative process of the bone marrow. He is not interested in proceeding with the bone marrow at this point and would like to continue on observation. I would see him back for follow up visit in 3 months with repeat CBC and iron study. He was advised to call immediately if he has any concerning symptoms in the interval. The patient voices understanding of current disease status and treatment options and is in agreement with the current care plan.  All questions were answered. The patient knows to call the clinic with any problems, questions or concerns. We can certainly see the patient much sooner if necessary.  Disclaimer: This note was dictated with voice recognition software. Similar sounding words can inadvertently be transcribed and may not be corrected upon review.

## 2014-08-09 NOTE — Telephone Encounter (Signed)
Pt confirmed labs/ov per 09/15 POF, gave pt AVS......KJ °

## 2014-08-18 ENCOUNTER — Ambulatory Visit
Admission: RE | Admit: 2014-08-18 | Discharge: 2014-08-18 | Disposition: A | Discharge status: Home / Self Care | Payer: Commercial Managed Care - HMO | Source: Ambulatory Visit | Attending: Family Medicine | Admitting: Family Medicine

## 2014-08-18 ENCOUNTER — Other Ambulatory Visit: Payer: Self-pay | Admitting: Family Medicine

## 2014-08-18 DIAGNOSIS — R059 Cough, unspecified: Secondary | ICD-10-CM

## 2014-08-18 DIAGNOSIS — R05 Cough: Secondary | ICD-10-CM

## 2014-10-25 ENCOUNTER — Telehealth: Payer: Self-pay | Admitting: Internal Medicine

## 2014-10-25 NOTE — Telephone Encounter (Signed)
pt called to cx appt....pcp handled medical problem

## 2014-11-04 ENCOUNTER — Other Ambulatory Visit: Payer: Commercial Managed Care - HMO

## 2014-11-10 ENCOUNTER — Ambulatory Visit: Payer: Commercial Managed Care - HMO | Admitting: Internal Medicine

## 2015-01-06 DIAGNOSIS — E538 Deficiency of other specified B group vitamins: Secondary | ICD-10-CM | POA: Diagnosis not present

## 2015-03-02 DIAGNOSIS — C61 Malignant neoplasm of prostate: Secondary | ICD-10-CM | POA: Diagnosis not present

## 2015-03-02 DIAGNOSIS — R350 Frequency of micturition: Secondary | ICD-10-CM | POA: Diagnosis not present

## 2015-04-06 DIAGNOSIS — E538 Deficiency of other specified B group vitamins: Secondary | ICD-10-CM | POA: Diagnosis not present

## 2015-07-07 DIAGNOSIS — E538 Deficiency of other specified B group vitamins: Secondary | ICD-10-CM | POA: Diagnosis not present

## 2015-09-04 DIAGNOSIS — Z8546 Personal history of malignant neoplasm of prostate: Secondary | ICD-10-CM | POA: Diagnosis not present

## 2015-09-04 DIAGNOSIS — D510 Vitamin B12 deficiency anemia due to intrinsic factor deficiency: Secondary | ICD-10-CM | POA: Diagnosis not present

## 2015-09-04 DIAGNOSIS — Z1389 Encounter for screening for other disorder: Secondary | ICD-10-CM | POA: Diagnosis not present

## 2015-09-04 DIAGNOSIS — I10 Essential (primary) hypertension: Secondary | ICD-10-CM | POA: Diagnosis not present

## 2015-09-04 DIAGNOSIS — Z23 Encounter for immunization: Secondary | ICD-10-CM | POA: Diagnosis not present

## 2015-09-04 DIAGNOSIS — R7309 Other abnormal glucose: Secondary | ICD-10-CM | POA: Diagnosis not present

## 2015-09-04 DIAGNOSIS — E538 Deficiency of other specified B group vitamins: Secondary | ICD-10-CM | POA: Diagnosis not present

## 2015-09-07 ENCOUNTER — Other Ambulatory Visit: Payer: Self-pay | Admitting: Urology

## 2015-09-07 DIAGNOSIS — C61 Malignant neoplasm of prostate: Secondary | ICD-10-CM | POA: Diagnosis not present

## 2015-09-07 DIAGNOSIS — R3915 Urgency of urination: Secondary | ICD-10-CM | POA: Diagnosis not present

## 2015-09-07 DIAGNOSIS — N3281 Overactive bladder: Secondary | ICD-10-CM | POA: Diagnosis not present

## 2015-09-19 ENCOUNTER — Encounter (HOSPITAL_COMMUNITY)
Admission: RE | Admit: 2015-09-19 | Discharge: 2015-09-19 | Disposition: A | Discharge status: Home / Self Care | Payer: Commercial Managed Care - HMO | Source: Ambulatory Visit | Attending: Urology | Admitting: Urology

## 2015-09-19 DIAGNOSIS — C61 Malignant neoplasm of prostate: Secondary | ICD-10-CM | POA: Diagnosis not present

## 2015-09-19 MED ORDER — TECHNETIUM TC 99M MEDRONATE IV KIT
25.0000 | PACK | Freq: Once | INTRAVENOUS | Status: AC | PRN
  Administered 2015-09-19: 25 via INTRAVENOUS

## 2015-10-06 DIAGNOSIS — E538 Deficiency of other specified B group vitamins: Secondary | ICD-10-CM | POA: Diagnosis not present

## 2015-10-09 DIAGNOSIS — R05 Cough: Secondary | ICD-10-CM | POA: Diagnosis not present

## 2015-10-09 DIAGNOSIS — J209 Acute bronchitis, unspecified: Secondary | ICD-10-CM | POA: Diagnosis not present

## 2015-10-13 DIAGNOSIS — C7951 Secondary malignant neoplasm of bone: Secondary | ICD-10-CM | POA: Diagnosis not present

## 2015-10-13 DIAGNOSIS — N3281 Overactive bladder: Secondary | ICD-10-CM | POA: Diagnosis not present

## 2015-10-13 DIAGNOSIS — C61 Malignant neoplasm of prostate: Secondary | ICD-10-CM | POA: Diagnosis not present

## 2015-11-02 DIAGNOSIS — C61 Malignant neoplasm of prostate: Secondary | ICD-10-CM | POA: Diagnosis not present

## 2016-01-05 DIAGNOSIS — E538 Deficiency of other specified B group vitamins: Secondary | ICD-10-CM | POA: Diagnosis not present

## 2016-01-19 DIAGNOSIS — C7951 Secondary malignant neoplasm of bone: Secondary | ICD-10-CM | POA: Diagnosis not present

## 2016-01-19 DIAGNOSIS — Z0000 Encounter for general adult medical examination without abnormal findings: Secondary | ICD-10-CM | POA: Diagnosis not present

## 2016-01-19 DIAGNOSIS — N3281 Overactive bladder: Secondary | ICD-10-CM | POA: Diagnosis not present

## 2016-01-19 DIAGNOSIS — C61 Malignant neoplasm of prostate: Secondary | ICD-10-CM | POA: Diagnosis not present

## 2016-02-28 ENCOUNTER — Emergency Department (HOSPITAL_COMMUNITY): Payer: Commercial Managed Care - HMO

## 2016-02-28 ENCOUNTER — Encounter (HOSPITAL_COMMUNITY): Payer: Self-pay | Admitting: *Deleted

## 2016-02-28 ENCOUNTER — Emergency Department (HOSPITAL_COMMUNITY)
Admission: EM | Admit: 2016-02-28 | Discharge: 2016-02-28 | Disposition: A | Discharge status: Home / Self Care | Payer: Commercial Managed Care - HMO | Attending: Emergency Medicine | Admitting: Emergency Medicine

## 2016-02-28 DIAGNOSIS — Z7982 Long term (current) use of aspirin: Secondary | ICD-10-CM | POA: Diagnosis not present

## 2016-02-28 DIAGNOSIS — Z8546 Personal history of malignant neoplasm of prostate: Secondary | ICD-10-CM | POA: Diagnosis not present

## 2016-02-28 DIAGNOSIS — K219 Gastro-esophageal reflux disease without esophagitis: Secondary | ICD-10-CM | POA: Diagnosis not present

## 2016-02-28 DIAGNOSIS — R1030 Lower abdominal pain, unspecified: Secondary | ICD-10-CM | POA: Diagnosis not present

## 2016-02-28 DIAGNOSIS — R110 Nausea: Secondary | ICD-10-CM | POA: Insufficient documentation

## 2016-02-28 DIAGNOSIS — K5900 Constipation, unspecified: Secondary | ICD-10-CM | POA: Insufficient documentation

## 2016-02-28 DIAGNOSIS — R630 Anorexia: Secondary | ICD-10-CM | POA: Diagnosis not present

## 2016-02-28 DIAGNOSIS — R1084 Generalized abdominal pain: Secondary | ICD-10-CM

## 2016-02-28 DIAGNOSIS — Z79899 Other long term (current) drug therapy: Secondary | ICD-10-CM | POA: Diagnosis not present

## 2016-02-28 DIAGNOSIS — Z8639 Personal history of other endocrine, nutritional and metabolic disease: Secondary | ICD-10-CM | POA: Diagnosis not present

## 2016-02-28 DIAGNOSIS — I10 Essential (primary) hypertension: Secondary | ICD-10-CM | POA: Diagnosis not present

## 2016-02-28 DIAGNOSIS — I209 Angina pectoris, unspecified: Secondary | ICD-10-CM | POA: Insufficient documentation

## 2016-02-28 LAB — COMPREHENSIVE METABOLIC PANEL
ALBUMIN: 3.5 g/dL (ref 3.5–5.0)
ALK PHOS: 65 U/L (ref 38–126)
ALT: 15 U/L — AB (ref 17–63)
AST: 25 U/L (ref 15–41)
Anion gap: 15 (ref 5–15)
BILIRUBIN TOTAL: 1.1 mg/dL (ref 0.3–1.2)
BUN: <5 mg/dL — ABNORMAL LOW (ref 6–20)
CALCIUM: 9.3 mg/dL (ref 8.9–10.3)
CO2: 24 mmol/L (ref 22–32)
CREATININE: 0.86 mg/dL (ref 0.61–1.24)
Chloride: 98 mmol/L — ABNORMAL LOW (ref 101–111)
GFR calc Af Amer: >60 mL/min (ref >60)
GFR calc non Af Amer: >60 mL/min (ref >60)
Glucose, Bld: 141 mg/dL — ABNORMAL HIGH (ref 65–99)
Potassium: 3.1 mmol/L — ABNORMAL LOW (ref 3.5–5.1)
SODIUM: 137 mmol/L (ref 135–145)
Total Protein: 6.6 g/dL (ref 6.5–8.1)

## 2016-02-28 LAB — DIFFERENTIAL
Basophils Absolute: 0 10*3/uL (ref 0.0–0.1)
Basophils Relative: 0 %
EOS ABS: 0.1 10*3/uL (ref 0.0–0.7)
EOS PCT: 1 %
LYMPHS ABS: 1.8 10*3/uL (ref 0.7–4.0)
Lymphocytes Relative: 30 %
MONOS PCT: 8 %
Monocytes Absolute: 0.4 10*3/uL (ref 0.1–1.0)
Neutro Abs: 3.6 10*3/uL (ref 1.7–7.7)
Neutrophils Relative %: 61 %

## 2016-02-28 LAB — URINE MICROSCOPIC-ADD ON

## 2016-02-28 LAB — CBC
HCT: 33 % — ABNORMAL LOW (ref 39.0–52.0)
Hemoglobin: 11.3 g/dL — ABNORMAL LOW (ref 13.0–17.0)
MCH: 32.7 pg (ref 26.0–34.0)
MCHC: 34.2 g/dL (ref 30.0–36.0)
MCV: 95.4 fL (ref 78.0–100.0)
PLATELETS: 176 10*3/uL (ref 150–400)
RBC: 3.46 MIL/uL — AB (ref 4.22–5.81)
RDW: 16.2 % — AB (ref 11.5–15.5)
WBC: 5.9 10*3/uL (ref 4.0–10.5)

## 2016-02-28 LAB — URINALYSIS, ROUTINE W REFLEX MICROSCOPIC
Glucose, UA: NEGATIVE mg/dL
HGB URINE DIPSTICK: NEGATIVE
KETONES UR: 15 mg/dL — AB
Nitrite: POSITIVE — AB
PROTEIN: 100 mg/dL — AB
Specific Gravity, Urine: 1.022 (ref 1.005–1.030)
pH: 8 (ref 5.0–8.0)

## 2016-02-28 MED ORDER — IOPAMIDOL (ISOVUE-300) INJECTION 61%
INTRAVENOUS | Status: AC
  Administered 2016-02-28: 100 mL
  Filled 2016-02-28: qty 100

## 2016-02-28 NOTE — Discharge Instructions (Signed)

## 2016-02-28 NOTE — ED Provider Notes (Signed)
CSN: [id number]     Arrival date & time 02/28/16  [id number] History   First MD Initiated Contact with Patient 02/28/16 [phone]     Chief Complaint  Patient presents with  . Abdominal Pain      Patient is a 75 y.o. male presenting with abdominal pain. The history is provided by the patient.  Abdominal Pain Associated symptoms: constipation and nausea   Associated symptoms: no chest pain and no shortness of breath   Patient presents with constipation. States that he has had some difficulty having a bowel movement for last few days. States he then took milk of magnesia and had a good bowel movement began to have more pain. Pain in his lower abdomen. States he's had a decreased appetite and some nausea. No fevers. No chills. States the bowel movement he had was pretty normal. No dysuria. The pain is dull and constant. Previous history of prostate cancer with prostate removal.  Past Medical History  Diagnosis Date  . Hypertension   . High cholesterol   . Anginal pain ([employer])     "that's why I'm here" (04/13/2013)  . Exertional shortness of breath     "sometimes" (04/13/2013)  . GERD (gastroesophageal reflux disease)   . Prostate cancer ([place]) 1994  . Aneurysm of infrarenal abdominal aorta (HCC)     Archie Endo 05/25/2009 (04/13/2013)   Past Surgical History  Procedure Laterality Date  . Prostatectomy  ~ 1994  . Penile prosthesis implant  ? 1999  . Hernia repair  1999    "in my stomach" (04/13/2013)   History reviewed. No pertinent family history. Social History  Substance Use Topics  . Smoking status: Never Smoker   . Smokeless tobacco: Current User    Types: Chew  . Alcohol Use: 33.6 oz/week    56 Shots of liquor per week     Comment: 04/13/2013  1/2 of 1/5th bottle of brandy usually, sometimes whiskey/day"    Review of Systems  Constitutional: Positive for appetite change.  Respiratory: Negative for shortness of breath.   Cardiovascular: Negative for chest pain.  Gastrointestinal: Positive for  nausea, abdominal pain and constipation.  Genitourinary: Negative for flank pain.  Musculoskeletal: Negative for back pain.  Skin: Negative for wound.  Neurological: Negative for speech difficulty.      Allergies  Review of patient's allergies indicates no known allergies.  Home Medications   Prior to Admission medications   Medication Sig Start Date End Date Taking? Authorizing Provider  aspirin EC 81 MG tablet Take 81 mg by mouth daily.   Yes Historical Provider, MD  Calcium Carbonate-Vitamin D (CALCIUM 600+D) 600-400 MG-UNIT per tablet 2 tablet with food   Yes Historical Provider, MD  doxazosin (CARDURA) 8 MG tablet Take 4 mg by mouth daily.   Yes Historical Provider, MD  lisinopril (PRINIVIL,ZESTRIL) 10 MG tablet Take 1 tablet (10 mg total) by mouth daily. 04/15/13  Yes Janece Canterbury, MD  metoprolol tartrate (LOPRESSOR) 25 MG tablet Take 1 tablet (25 mg total) by mouth 2 (two) times daily. 04/15/13  Yes Janece Canterbury, MD  Multiple Vitamins-Minerals (MULTIVITAMIN WITH MINERALS) tablet Take 1 tablet by mouth daily.   Yes Historical Provider, MD  omeprazole (PRILOSEC) 20 MG capsule Take 20 mg by mouth daily.  07/05/14  Yes Historical Provider, MD  oxybutynin (DITROPAN-XL) 10 MG 24 hr tablet Take 10 mg by mouth daily.   Yes Historical Provider, MD  Cyanocobalamin (VITAMIN B-12 IJ) Inject 1 each into the muscle every 3 (three) months.  1 injection IM q105months    Historical Provider, MD  phenol (CHLORASEPTIC) 1.4 % LIQD Use as directed 1 spray in the mouth or throat as needed. 04/15/13   Janece Canterbury, MD   BP 170/98 mmHg  Pulse 89  Temp(Src) 98.6 F (37 C) (Oral)  Resp 18  Ht 5\' 4"  (1.626 m)  Wt 235 lb (106.595 kg)  BMI 40.32 kg/m2  SpO2 95% Physical Exam  Constitutional: He appears well-developed.  HENT:  Head: Atraumatic.  Neck: Neck supple.  Cardiovascular: Normal rate.   Pulmonary/Chest: Effort normal.  Abdominal: He exhibits no distension. There is no tenderness.   Vertical suprapubic scar. Infraumbilical horizontal scar also. Mild suprapubic tenderness without rebound or guarding. No hernias palpated  Musculoskeletal: Normal range of motion.  Neurological: He is alert.  Skin: Skin is warm.    ED Course  Procedures (including critical care time) Labs Review Labs Reviewed  COMPREHENSIVE METABOLIC PANEL - Abnormal; Notable for the following:    Potassium 3.1 (*)    Chloride 98 (*)    Glucose, Bld 141 (*)    BUN <5 (*)    ALT 15 (*)    All other components within normal limits  CBC - Abnormal; Notable for the following:    RBC 3.46 (*)    Hemoglobin 11.3 (*)    HCT 33.0 (*)    RDW 16.2 (*)    All other components within normal limits  URINALYSIS, ROUTINE W REFLEX MICROSCOPIC (NOT AT [facility]) - Abnormal; Notable for the following:    Color, Urine AMBER (*)    APPearance CLOUDY (*)    Bilirubin Urine SMALL (*)    Ketones, ur 15 (*)    Protein, ur 100 (*)    Nitrite POSITIVE (*)    Leukocytes, UA SMALL (*)    All other components within normal limits  URINE MICROSCOPIC-ADD ON - Abnormal; Notable for the following:    Squamous Epithelial / LPF 0-5 (*)    Bacteria, UA FEW (*)    Casts HYALINE CASTS (*)    All other components within normal limits  DIFFERENTIAL    Imaging Review Ct Abdomen Pelvis W Contrast  02/28/2016  CLINICAL DATA:  Low abd pain x 3 days. left greater than right. Some n/v. Constipation x 3 days. EXAM: CT ABDOMEN AND PELVIS WITH CONTRAST TECHNIQUE: Multidetector CT imaging of the abdomen and pelvis was performed using the standard protocol following bolus administration of intravenous contrast. CONTRAST:  144mL ISOVUE-300 IOPAMIDOL (ISOVUE-300) INJECTION 61% COMPARISON:  CT 11/02/2015 FINDINGS: Lower chest: Coarse subpleural scarring and bronchiectasis in the visualized lung bases. No pleural or pericardial effusion. Hepatobiliary: No masses or other significant abnormality. Pancreas: No mass, inflammatory changes, or other  significant abnormality. Spleen: Within normal limits in size and appearance. Adrenals/Urinary Tract: No masses identified. No evidence of hydronephrosis. 13 mm left upper pole cyst stable. Stomach/Bowel: Small hiatal hernia. Stomach, small bowel, and colon are nondilated. Normal appendix. Scattered diverticula from the descending and sigmoid segments of the colon, without adjacent inflammatory/edematous change or abscess. Vascular/Lymphatic: Patchy aortoiliac arterial calcifications without aneurysm. Portal vein patent. No adenopathy localized. Reproductive: Penile prosthesis component incompletely visualized. Previous prostatectomy. Other: No ascites.  No free air. Musculoskeletal:  Facet DJD in the lower lumbar spine. IMPRESSION: 1. No acute abdominal process. 2. Small hiatal hernia. 3. Descending and sigmoid diverticulosis. 4. Postop changes as above. Electronically Signed   By: Lucrezia Europe M.D.   On: 02/28/2016 10:37   Dg Abd 2 Views  02/28/2016  CLINICAL DATA:  LOW/MID ABDOMEN PAIN,NAUSEA FOR 3 DAYS,CONSTIPATION EXAM: ABDOMEN - 2 VIEW COMPARISON:  CT 11/02/2015 FINDINGS: Visualized lung bases clear. No free air. Normal bowel gas pattern. No abnormal abdominal calcifications. Surgical clips along bilateral pelvic sidewalls. Mild facet DJD in the lower lumbar spine. IMPRESSION: Normal bowel gas pattern. Electronically Signed   By: Lucrezia Europe M.D.   On: 02/28/2016 07:45   I have personally reviewed and evaluated these images and lab results as part of my medical decision-making.   EKG Interpretation None      MDM   Final diagnoses:  Constipation  Generalized abdominal pain    Patient with abdominal pain. CT scan reassuring. No stool on rectal exam. Labs reassuring. Will discharge home.    Davonna Belling, MD 02/28/16 [phone]

## 2016-02-28 NOTE — ED Notes (Signed)
Pt c/o lower abdominal pain x 3 days. Last bowel movement was two days ago, took milk of magnesium without relief of constipation, has been having increased pain since. Denies urinary issues.

## 2016-02-28 NOTE — ED Notes (Signed)
Patient transported to X-ray 

## 2016-02-28 NOTE — ED Notes (Signed)
Patient transported to CT 

## 2016-04-01 ENCOUNTER — Other Ambulatory Visit: Payer: Self-pay | Admitting: Family Medicine

## 2016-04-01 ENCOUNTER — Ambulatory Visit
Admission: RE | Admit: 2016-04-01 | Discharge: 2016-04-01 | Disposition: A | Discharge status: Home / Self Care | Payer: Commercial Managed Care - HMO | Source: Ambulatory Visit | Attending: Family Medicine | Admitting: Family Medicine

## 2016-04-01 DIAGNOSIS — R918 Other nonspecific abnormal finding of lung field: Secondary | ICD-10-CM | POA: Diagnosis not present

## 2016-04-01 DIAGNOSIS — G5713 Meralgia paresthetica, bilateral lower limbs: Secondary | ICD-10-CM | POA: Diagnosis not present

## 2016-04-01 DIAGNOSIS — E538 Deficiency of other specified B group vitamins: Secondary | ICD-10-CM | POA: Diagnosis not present

## 2016-04-01 DIAGNOSIS — R0609 Other forms of dyspnea: Principal | ICD-10-CM

## 2016-04-01 DIAGNOSIS — R630 Anorexia: Secondary | ICD-10-CM | POA: Diagnosis not present

## 2016-04-01 DIAGNOSIS — R634 Abnormal weight loss: Secondary | ICD-10-CM | POA: Diagnosis not present

## 2016-04-01 DIAGNOSIS — R5383 Other fatigue: Secondary | ICD-10-CM | POA: Diagnosis not present

## 2016-04-02 ENCOUNTER — Other Ambulatory Visit: Payer: Self-pay | Admitting: Family Medicine

## 2016-04-02 DIAGNOSIS — R748 Abnormal levels of other serum enzymes: Secondary | ICD-10-CM

## 2016-04-02 DIAGNOSIS — R5383 Other fatigue: Secondary | ICD-10-CM

## 2016-04-08 ENCOUNTER — Ambulatory Visit
Admission: RE | Admit: 2016-04-08 | Discharge: 2016-04-08 | Disposition: A | Discharge status: Home / Self Care | Payer: Commercial Managed Care - HMO | Source: Ambulatory Visit | Attending: Family Medicine | Admitting: Family Medicine

## 2016-04-08 DIAGNOSIS — R5383 Other fatigue: Secondary | ICD-10-CM

## 2016-04-08 DIAGNOSIS — R7989 Other specified abnormal findings of blood chemistry: Secondary | ICD-10-CM | POA: Diagnosis not present

## 2016-04-08 DIAGNOSIS — R748 Abnormal levels of other serum enzymes: Secondary | ICD-10-CM

## 2016-04-10 ENCOUNTER — Other Ambulatory Visit (HOSPITAL_COMMUNITY): Payer: Self-pay | Admitting: Family Medicine

## 2016-04-10 DIAGNOSIS — R0609 Other forms of dyspnea: Principal | ICD-10-CM

## 2016-04-17 ENCOUNTER — Other Ambulatory Visit (HOSPITAL_COMMUNITY): Payer: Self-pay | Admitting: Family Medicine

## 2016-04-17 DIAGNOSIS — R0609 Other forms of dyspnea: Principal | ICD-10-CM

## 2016-04-26 ENCOUNTER — Ambulatory Visit (HOSPITAL_COMMUNITY): Payer: Commercial Managed Care - HMO | Attending: Cardiology

## 2016-04-26 ENCOUNTER — Other Ambulatory Visit: Payer: Self-pay

## 2016-04-26 DIAGNOSIS — I371 Nonrheumatic pulmonary valve insufficiency: Secondary | ICD-10-CM | POA: Insufficient documentation

## 2016-04-26 DIAGNOSIS — I071 Rheumatic tricuspid insufficiency: Secondary | ICD-10-CM | POA: Diagnosis not present

## 2016-04-26 DIAGNOSIS — I119 Hypertensive heart disease without heart failure: Secondary | ICD-10-CM | POA: Insufficient documentation

## 2016-04-26 DIAGNOSIS — E785 Hyperlipidemia, unspecified: Secondary | ICD-10-CM | POA: Insufficient documentation

## 2016-04-26 DIAGNOSIS — Z6841 Body Mass Index (BMI) 40.0 and over, adult: Secondary | ICD-10-CM | POA: Insufficient documentation

## 2016-04-26 DIAGNOSIS — R0609 Other forms of dyspnea: Secondary | ICD-10-CM | POA: Diagnosis not present

## 2016-04-26 DIAGNOSIS — R0600 Dyspnea, unspecified: Secondary | ICD-10-CM | POA: Diagnosis present

## 2016-07-12 DIAGNOSIS — C61 Malignant neoplasm of prostate: Secondary | ICD-10-CM | POA: Diagnosis not present

## 2016-07-19 DIAGNOSIS — C61 Malignant neoplasm of prostate: Secondary | ICD-10-CM | POA: Diagnosis not present

## 2016-07-19 DIAGNOSIS — N3281 Overactive bladder: Secondary | ICD-10-CM | POA: Diagnosis not present

## 2016-08-07 DIAGNOSIS — E161 Other hypoglycemia: Secondary | ICD-10-CM | POA: Diagnosis not present

## 2016-08-14 ENCOUNTER — Other Ambulatory Visit: Payer: Self-pay | Admitting: Family Medicine

## 2016-08-14 DIAGNOSIS — R112 Nausea with vomiting, unspecified: Secondary | ICD-10-CM

## 2016-08-14 DIAGNOSIS — Z6836 Body mass index (BMI) 36.0-36.9, adult: Secondary | ICD-10-CM | POA: Diagnosis not present

## 2016-08-14 DIAGNOSIS — E162 Hypoglycemia, unspecified: Secondary | ICD-10-CM | POA: Diagnosis not present

## 2016-08-14 DIAGNOSIS — R634 Abnormal weight loss: Secondary | ICD-10-CM | POA: Diagnosis not present

## 2016-08-14 DIAGNOSIS — R748 Abnormal levels of other serum enzymes: Secondary | ICD-10-CM | POA: Diagnosis not present

## 2016-08-19 ENCOUNTER — Ambulatory Visit
Admission: RE | Admit: 2016-08-19 | Discharge: 2016-08-19 | Disposition: A | Discharge status: Home / Self Care | Payer: Commercial Managed Care - HMO | Source: Ambulatory Visit | Attending: Family Medicine | Admitting: Family Medicine

## 2016-08-19 DIAGNOSIS — R634 Abnormal weight loss: Secondary | ICD-10-CM

## 2016-08-19 DIAGNOSIS — R112 Nausea with vomiting, unspecified: Secondary | ICD-10-CM

## 2016-08-19 MED ORDER — IOPAMIDOL (ISOVUE-300) INJECTION 61%
125.0000 mL | Freq: Once | INTRAVENOUS | Status: AC | PRN
  Administered 2016-08-19: 125 mL via INTRAVENOUS

## 2016-09-03 DIAGNOSIS — D510 Vitamin B12 deficiency anemia due to intrinsic factor deficiency: Secondary | ICD-10-CM | POA: Diagnosis not present

## 2016-09-03 DIAGNOSIS — Z8546 Personal history of malignant neoplasm of prostate: Secondary | ICD-10-CM | POA: Diagnosis not present

## 2016-09-03 DIAGNOSIS — I10 Essential (primary) hypertension: Secondary | ICD-10-CM | POA: Diagnosis not present

## 2016-09-03 DIAGNOSIS — R634 Abnormal weight loss: Secondary | ICD-10-CM | POA: Diagnosis not present

## 2016-09-03 DIAGNOSIS — R7309 Other abnormal glucose: Secondary | ICD-10-CM | POA: Diagnosis not present

## 2016-09-03 DIAGNOSIS — E538 Deficiency of other specified B group vitamins: Secondary | ICD-10-CM | POA: Diagnosis not present

## 2016-09-17 DIAGNOSIS — R933 Abnormal findings on diagnostic imaging of other parts of digestive tract: Secondary | ICD-10-CM | POA: Diagnosis not present

## 2016-09-17 DIAGNOSIS — R634 Abnormal weight loss: Secondary | ICD-10-CM | POA: Diagnosis not present

## 2016-09-18 DIAGNOSIS — D510 Vitamin B12 deficiency anemia due to intrinsic factor deficiency: Secondary | ICD-10-CM | POA: Diagnosis not present

## 2016-09-18 DIAGNOSIS — E538 Deficiency of other specified B group vitamins: Secondary | ICD-10-CM | POA: Diagnosis not present

## 2016-10-01 DIAGNOSIS — R634 Abnormal weight loss: Secondary | ICD-10-CM | POA: Diagnosis not present

## 2016-10-01 DIAGNOSIS — K319 Disease of stomach and duodenum, unspecified: Secondary | ICD-10-CM | POA: Diagnosis not present

## 2016-10-01 DIAGNOSIS — K2900 Acute gastritis without bleeding: Secondary | ICD-10-CM | POA: Diagnosis not present

## 2016-10-01 DIAGNOSIS — K3189 Other diseases of stomach and duodenum: Secondary | ICD-10-CM | POA: Diagnosis not present

## 2016-10-01 DIAGNOSIS — R933 Abnormal findings on diagnostic imaging of other parts of digestive tract: Secondary | ICD-10-CM | POA: Diagnosis not present

## 2016-10-03 DIAGNOSIS — K3189 Other diseases of stomach and duodenum: Secondary | ICD-10-CM | POA: Diagnosis not present

## 2016-10-09 DIAGNOSIS — R05 Cough: Secondary | ICD-10-CM | POA: Diagnosis not present

## 2016-10-09 DIAGNOSIS — I10 Essential (primary) hypertension: Secondary | ICD-10-CM | POA: Diagnosis not present

## 2016-11-26 DIAGNOSIS — C61 Malignant neoplasm of prostate: Secondary | ICD-10-CM | POA: Diagnosis not present

## 2016-12-20 DIAGNOSIS — E538 Deficiency of other specified B group vitamins: Secondary | ICD-10-CM | POA: Diagnosis not present

## 2016-12-31 DIAGNOSIS — N3281 Overactive bladder: Secondary | ICD-10-CM | POA: Diagnosis not present

## 2016-12-31 DIAGNOSIS — C61 Malignant neoplasm of prostate: Secondary | ICD-10-CM | POA: Diagnosis not present

## 2017-01-14 DIAGNOSIS — H40013 Open angle with borderline findings, low risk, bilateral: Secondary | ICD-10-CM | POA: Diagnosis not present

## 2017-01-14 DIAGNOSIS — H2513 Age-related nuclear cataract, bilateral: Secondary | ICD-10-CM | POA: Diagnosis not present

## 2017-01-23 DIAGNOSIS — H2513 Age-related nuclear cataract, bilateral: Secondary | ICD-10-CM | POA: Diagnosis not present

## 2017-01-23 DIAGNOSIS — H401232 Low-tension glaucoma, bilateral, moderate stage: Secondary | ICD-10-CM | POA: Diagnosis not present

## 2017-01-24 ENCOUNTER — Other Ambulatory Visit: Payer: Self-pay | Admitting: Ophthalmology

## 2017-02-18 DIAGNOSIS — H5340 Unspecified visual field defects: Secondary | ICD-10-CM | POA: Diagnosis not present

## 2017-03-27 DIAGNOSIS — E538 Deficiency of other specified B group vitamins: Secondary | ICD-10-CM | POA: Diagnosis not present

## 2017-04-27 ENCOUNTER — Encounter (HOSPITAL_COMMUNITY): Payer: Self-pay

## 2017-04-27 ENCOUNTER — Emergency Department (HOSPITAL_COMMUNITY)
Admission: EM | Admit: 2017-04-27 | Discharge: 2017-04-27 | Disposition: A | Discharge status: Home / Self Care | Payer: Medicare HMO | Attending: Emergency Medicine | Admitting: Emergency Medicine

## 2017-04-27 ENCOUNTER — Emergency Department (HOSPITAL_COMMUNITY): Payer: Medicare HMO

## 2017-04-27 DIAGNOSIS — S32010A Wedge compression fracture of first lumbar vertebra, initial encounter for closed fracture: Secondary | ICD-10-CM | POA: Diagnosis not present

## 2017-04-27 DIAGNOSIS — Y939 Activity, unspecified: Secondary | ICD-10-CM | POA: Diagnosis not present

## 2017-04-27 DIAGNOSIS — Y999 Unspecified external cause status: Secondary | ICD-10-CM | POA: Diagnosis not present

## 2017-04-27 DIAGNOSIS — M545 Low back pain: Secondary | ICD-10-CM | POA: Diagnosis not present

## 2017-04-27 DIAGNOSIS — F17290 Nicotine dependence, other tobacco product, uncomplicated: Secondary | ICD-10-CM | POA: Insufficient documentation

## 2017-04-27 DIAGNOSIS — Z7982 Long term (current) use of aspirin: Secondary | ICD-10-CM | POA: Diagnosis not present

## 2017-04-27 DIAGNOSIS — Y929 Unspecified place or not applicable: Secondary | ICD-10-CM | POA: Insufficient documentation

## 2017-04-27 DIAGNOSIS — S32000A Wedge compression fracture of unspecified lumbar vertebra, initial encounter for closed fracture: Secondary | ICD-10-CM | POA: Diagnosis not present

## 2017-04-27 DIAGNOSIS — Z79899 Other long term (current) drug therapy: Secondary | ICD-10-CM | POA: Diagnosis not present

## 2017-04-27 DIAGNOSIS — S3992XA Unspecified injury of lower back, initial encounter: Secondary | ICD-10-CM | POA: Diagnosis present

## 2017-04-27 DIAGNOSIS — S32018A Other fracture of first lumbar vertebra, initial encounter for closed fracture: Secondary | ICD-10-CM | POA: Insufficient documentation

## 2017-04-27 DIAGNOSIS — W010XXA Fall on same level from slipping, tripping and stumbling without subsequent striking against object, initial encounter: Secondary | ICD-10-CM | POA: Diagnosis not present

## 2017-04-27 DIAGNOSIS — I10 Essential (primary) hypertension: Secondary | ICD-10-CM | POA: Diagnosis not present

## 2017-04-27 DIAGNOSIS — M546 Pain in thoracic spine: Secondary | ICD-10-CM | POA: Diagnosis not present

## 2017-04-27 MED ORDER — TRAMADOL HCL 50 MG PO TABS: 50.0000 mg | ORAL_TABLET | Freq: Four times a day (QID) | ORAL | 0 refills | Status: AC | PRN

## 2017-04-27 MED ORDER — IBUPROFEN 400 MG PO TABS
400.0000 mg | ORAL_TABLET | Freq: Once | ORAL | Status: AC
  Administered 2017-04-27: 400 mg via ORAL
  Filled 2017-04-27: qty 1

## 2017-04-27 MED ORDER — TRAMADOL HCL 50 MG PO TABS
50.0000 mg | ORAL_TABLET | Freq: Once | ORAL | Status: AC
  Administered 2017-04-27: 50 mg via ORAL
  Filled 2017-04-27: qty 1

## 2017-04-27 NOTE — ED Provider Notes (Signed)
[facility] DEPT Provider Note   CSN: [id number] Arrival date & time: 04/27/17  8101     History   Chief Complaint Chief Complaint  Patient presents with  . Fall    HPI NAIF ALABI is a 76 y.o. male.  Patient s/p fall at home 2 days ago. States he tripped, and fell to ground. No faintness or dizziness prior to fall. No head injury or loc w fall. C/o mid and low back pain since fall. Pain constant, dull, mod-severe, worse w change of position, turning torso, certain movements. Denies radicular pain. No leg pain, numbness or weakness. No change in bowel or bladder habits. No hx chronic back pain, compression fxs, or ddd. No anterior pain, no cp or abd pain. No fever or chills.    The history is provided by the patient.  Fall  Pertinent negatives include no chest pain, no abdominal pain, no headaches and no shortness of breath.    Past Medical History:  Diagnosis Date  . Aneurysm of infrarenal abdominal aorta (HCC)    Archie Endo 05/25/2009 (04/13/2013)  . Anginal pain ([place])    "that's why I'm here" (04/13/2013)  . Exertional shortness of breath    "sometimes" (04/13/2013)  . GERD (gastroesophageal reflux disease)   . High cholesterol   . Hypertension   . Prostate cancer [facility]) 1994    Patient Active Problem List   Diagnosis Date Noted  . Unspecified deficiency anemia 07/18/2014  . Pharyngitis 04/14/2013  . Prostate cancer ([place]) 04/13/2013  . Essential hypertension, benign 04/13/2013  . Chest pain, unspecified 04/13/2013  . Alcohol abuse 04/13/2013    Past Surgical History:  Procedure Laterality Date  . HERNIA REPAIR  1999   "in my stomach" (04/13/2013)  . PENILE PROSTHESIS IMPLANT  ? 1999  . PROSTATECTOMY  ~ 1994       Home Medications    Prior to Admission medications   Medication Sig Start Date End Date Taking? Authorizing Provider  aspirin EC 81 MG tablet Take 81 mg by mouth daily.    [provider]  Calcium Carbonate-Vitamin D (CALCIUM 600+D)  600-400 MG-UNIT per tablet 2 tablet with food    [provider]  Cyanocobalamin (VITAMIN B-12 IJ) Inject 1 each into the muscle every 3 (three) months. 1 injection IM q31months    [provider]  doxazosin (CARDURA) 8 MG tablet Take 4 mg by mouth daily.    [provider]  lisinopril (PRINIVIL,ZESTRIL) 10 MG tablet Take 1 tablet (10 mg total) by mouth daily. 04/15/13   Janece Canterbury, MD  metoprolol tartrate (LOPRESSOR) 25 MG tablet Take 1 tablet (25 mg total) by mouth 2 (two) times daily. 04/15/13   Janece Canterbury, MD  Multiple Vitamins-Minerals (MULTIVITAMIN WITH MINERALS) tablet Take 1 tablet by mouth daily.    [provider]  omeprazole (PRILOSEC) 20 MG capsule Take 20 mg by mouth daily.  07/05/14   [provider]  oxybutynin (DITROPAN-XL) 10 MG 24 hr tablet Take 10 mg by mouth daily.    [provider]  phenol (CHLORASEPTIC) 1.4 % LIQD Use as directed 1 spray in the mouth or throat as needed. 04/15/13   Janece Canterbury, MD    Family History No family history on file.  Social History Social History  Substance Use Topics  . Smoking status: Never Smoker  . Smokeless tobacco: Current User    Types: Chew  . Alcohol use 33.6 oz/week    56 Shots of liquor per week  Comment: 04/13/2013  1/2 of 1/5th bottle of brandy usually, sometimes whiskey/day"     Allergies   Patient has no known allergies.   Review of Systems Review of Systems  Constitutional: Negative for fever.  Respiratory: Negative for shortness of breath.   Cardiovascular: Negative for chest pain.  Gastrointestinal: Negative for abdominal pain, nausea and vomiting.  Genitourinary: Negative for flank pain and hematuria.  Musculoskeletal: Positive for back pain. Negative for neck pain.  Skin: Negative for wound.  Neurological: Negative for weakness, numbness and headaches.     Physical Exam Updated Vital Signs BP (!) 136/94 (BP Location: Left Arm)   Pulse  (!) 122   Temp 98.2 F (36.8 C) (Oral)   Resp 18   Ht 1.626 m (5\' 4" )   Wt 108.9 kg (240 lb)   SpO2 100%   BMI 41.20 kg/m   Physical Exam  Constitutional: He appears well-developed and well-nourished. No distress.  HENT:  Head: Atraumatic.  Eyes: Conjunctivae are normal. Pupils are equal, round, and reactive to light.  Neck: Normal range of motion. Neck supple. No tracheal deviation present.  Cardiovascular: Normal rate, regular rhythm, normal heart sounds and intact distal pulses.   Pulmonary/Chest: Effort normal and breath sounds normal. No accessory muscle usage. No respiratory distress. He exhibits no tenderness.  Abdominal: Soft. He exhibits no distension. There is no tenderness.  Genitourinary:  Genitourinary Comments: No cva tenderness  Musculoskeletal: He exhibits no edema.  Lower thoracic and upper lumbar tenderness. Spine aligned. No step off. Good rom bil. No focal bony tenderness on extremity exam.   Neurological: He is alert.  Motor intact bil legs, stre 5/5. sens grossly intact.   Skin: Skin is warm and dry. No rash noted. He is not diaphoretic.  Psychiatric: He has a normal mood and affect.  Nursing note and vitals reviewed.    ED Treatments / Results  Labs (all labs ordered are listed, but only abnormal results are displayed) Labs Reviewed - No data to display  EKG  EKG Interpretation None       Radiology Dg Thoracic Spine 2 View  Result Date: 04/27/2017 CLINICAL DATA:  Fall, back pain EXAM: THORACIC SPINE 2 VIEWS COMPARISON:  04/01/2016 chest x-ray FINDINGS: Mild degenerate spurring in the lower thoracic spine. Normal alignment. No fracture. IMPRESSION: No acute bony abnormality. Electronically Signed   By: Rolm Baptise M.D.   On: 04/27/2017 11:18   Dg Lumbar Spine Complete  Result Date: 04/27/2017 CLINICAL DATA:  Fall 2 days ago.  Back pain. EXAM: LUMBAR SPINE - COMPLETE 4+ VIEW COMPARISON:  None. FINDINGS: Mild to moderate compression fracture at L1  with anterior wedge deformity, new since prior CT. Degenerative facet disease in the lower lumbar spine. Slight anterolisthesis of L4 on L5. SI joints are symmetric and unremarkable. IMPRESSION: Mild to moderate compression fracture of L1. Electronically Signed   By: Rolm Baptise M.D.   On: 04/27/2017 11:20   Ct Lumbar Spine Wo Contrast  Result Date: 04/27/2017 CLINICAL DATA:  [place] 2 days ago at home. Low back pain. L1 compression fractures shown at radiography. EXAM: CT LUMBAR SPINE WITHOUT CONTRAST TECHNIQUE: Multidetector CT imaging of the lumbar spine was performed without intravenous contrast administration. Multiplanar CT image reconstructions were also generated. COMPARISON:  Radiography same day FINDINGS: Segmentation: 5 lumbar type vertebral bodies assumed. Alignment: Normal Vertebrae: Acute superior endplate fracture at L1 anteriorly with loss of height of 20%. No involvement of the posterior aspect. No retropulsed bone or traumatic disc  herniation. No other regional fracture, through S4. Paraspinal and other soft tissues: Negative Disc levels: No significant degenerative changes at L3-4 or above. L4-5: Bilateral facet arthropathy with gaping joints showing vacuum phenomenon. Bulging of the disc. Mild narrowing of both lateral recesses. Anterolisthesis would probably occur with standing or flexion, worsening the findings. L5-S1: Bulging of the disc. Bilateral facet osteoarthritis. No apparent compressive stenosis. IMPRESSION: Acute superior endplate fracture at L1 with loss of height anteriorly of 20%. No retropulsed bone or traumatic disc herniation. This looks like a benign fracture by CT. No other regional injury. Lower lumbar facet osteoarthritis which could be a cause of back pain. At L4-5, there could be anterolisthesis with standing or flexion based on the morphology of the facet arthropathy. Electronically Signed   By: Nelson Chimes M.D.   On: 04/27/2017 13:20    Procedures Procedures  (including critical care time)  Medications Ordered in ED Medications  ibuprofen (ADVIL,MOTRIN) tablet 400 mg (not administered)  traMADol (ULTRAM) tablet 50 mg (not administered)     Initial Impression / Assessment and Plan / ED Course  I have reviewed the triage vital signs and the nursing notes.  Pertinent labs & imaging results that were available during my care of the patient were reviewed by me and considered in my medical decision making (see chart for details).    No meds yet this AM.    Motrin po. Ultram po.  Reviewed nursing notes and prior charts for additional history.   Recheck pain improved.   Pt appears stable for d/c.  Discussed pcp f/u. Pain control, and possible discussion with pcp re kyphoplasty if pain not controlled w po meds.     Final Clinical Impressions(s) / ED Diagnoses   Final diagnoses:  None    New Prescriptions New Prescriptions   No medications on file     Lajean Saver, MD 04/27/17 1404

## 2017-04-27 NOTE — ED Triage Notes (Signed)
Per Pt, Pt is coming from home with reports of tripping and falling two days ago in his house. Denies LOC or hitting his head. Pt was on the ground for a couple hours until someone could help him up. Pt is alert and oriented at this time. Reports some change in both bowel and bladder, but denies any numbness and tingling. Reports pain with ambulation.

## 2017-04-27 NOTE — Discharge Instructions (Signed)
It was our pleasure to provide your ER care today - we hope that you feel better.  Your imaging studies show a L1 compression fracture.  Wear lumbar support/back brace as need for comfort.  Avoid bending at waist or heavy lifting > 10 lbs.   Try heat therapy to sore area.    Take motrin or aleve as need for pain.  You may also take ultram as need for pain - no driving for the next 4 hours, or when taking ultram.   Follow up with your doctor in the coming week -  if pain not controlled with oral medication, discuss possible referral for kyphoplasty procedure.   Return to ER if worse, new symptoms, fevers, intractable pain, other concern.

## 2017-06-23 DIAGNOSIS — C61 Malignant neoplasm of prostate: Secondary | ICD-10-CM | POA: Diagnosis not present

## 2017-06-26 DIAGNOSIS — E538 Deficiency of other specified B group vitamins: Secondary | ICD-10-CM | POA: Diagnosis not present

## 2017-07-03 DIAGNOSIS — C61 Malignant neoplasm of prostate: Secondary | ICD-10-CM | POA: Diagnosis not present

## 2017-07-03 DIAGNOSIS — N3281 Overactive bladder: Secondary | ICD-10-CM | POA: Diagnosis not present

## 2017-09-03 DIAGNOSIS — Z8546 Personal history of malignant neoplasm of prostate: Secondary | ICD-10-CM | POA: Diagnosis not present

## 2017-09-03 DIAGNOSIS — D510 Vitamin B12 deficiency anemia due to intrinsic factor deficiency: Secondary | ICD-10-CM | POA: Diagnosis not present

## 2017-09-03 DIAGNOSIS — R7309 Other abnormal glucose: Secondary | ICD-10-CM | POA: Diagnosis not present

## 2017-09-03 DIAGNOSIS — E669 Obesity, unspecified: Secondary | ICD-10-CM | POA: Diagnosis not present

## 2017-09-03 DIAGNOSIS — E538 Deficiency of other specified B group vitamins: Secondary | ICD-10-CM | POA: Diagnosis not present

## 2017-09-03 DIAGNOSIS — Z23 Encounter for immunization: Secondary | ICD-10-CM | POA: Diagnosis not present

## 2017-09-03 DIAGNOSIS — I10 Essential (primary) hypertension: Secondary | ICD-10-CM | POA: Diagnosis not present

## 2017-10-28 DIAGNOSIS — D510 Vitamin B12 deficiency anemia due to intrinsic factor deficiency: Secondary | ICD-10-CM | POA: Diagnosis not present

## 2017-10-28 DIAGNOSIS — Z8546 Personal history of malignant neoplasm of prostate: Secondary | ICD-10-CM | POA: Diagnosis not present

## 2017-10-28 DIAGNOSIS — I10 Essential (primary) hypertension: Secondary | ICD-10-CM | POA: Diagnosis not present

## 2017-11-10 IMAGING — CT CT ABD-PELV W/ CM
2 of 5 series · 16 of 46 positions shown, 18 images · IV contrast (iopamidol)
Comparison: CT 11/02/2015

CLINICAL DATA: Low abd pain x 3 days. left greater than right. Some
n/v. Constipation x 3 days.

EXAM:
CT ABDOMEN AND PELVIS WITH CONTRAST
TECHNIQUE: Multidetector CT imaging of the abdomen and pelvis was performed
using the standard protocol following bolus administration of
intravenous contrast.
CONTRAST:  100mL QQZ2A5-W33 IOPAMIDOL (QQZ2A5-W33) INJECTION 61%

[Series 2: abd/ pelvis 5.0 i30f 1 · axial · 0.73mm/px · z∈[+782,+1227]mm · 13 of 101 slices shown, 15 images]
[im 6/101  soft-tissue]
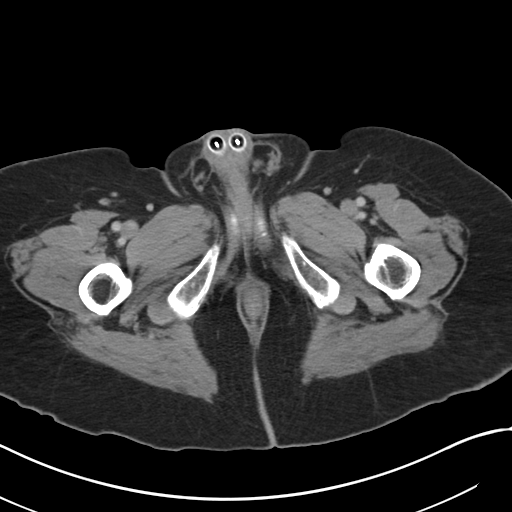
[im 6/101  bone]
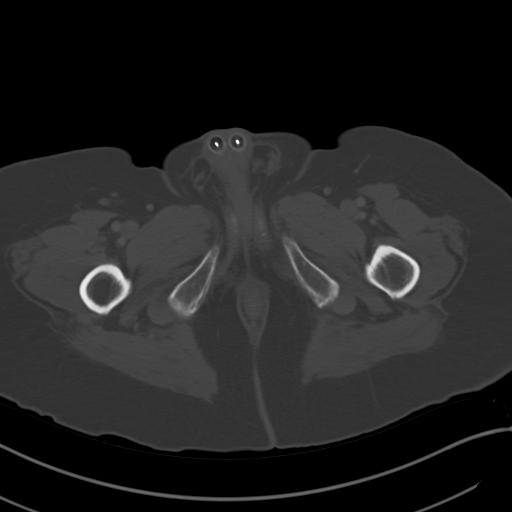
[im 12/101  soft-tissue]
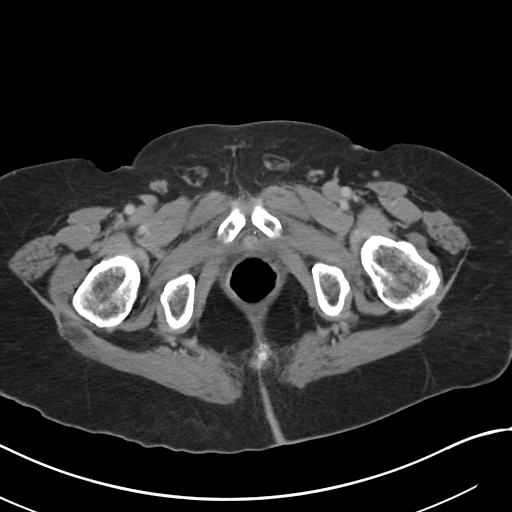
[im 23/101  soft-tissue]
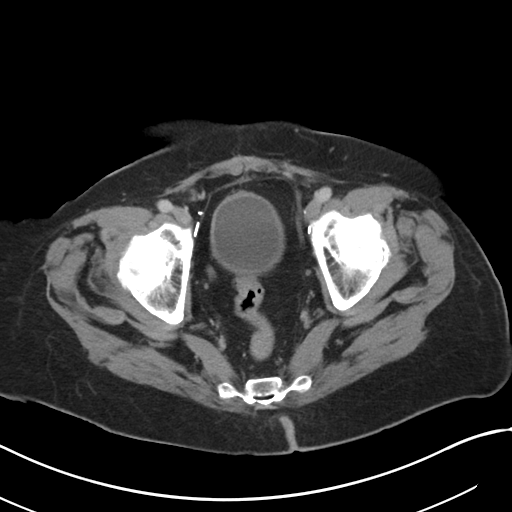
[im 28/101  soft-tissue]
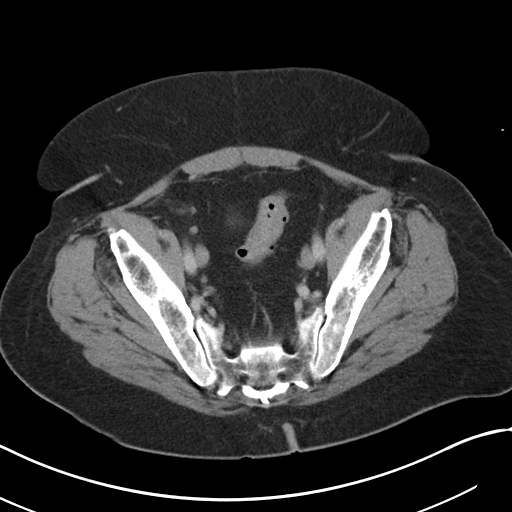
[im 34/101  soft-tissue]
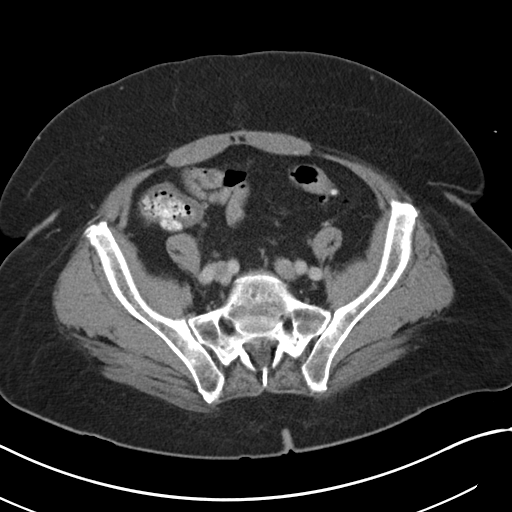
[im 45/101  soft-tissue]
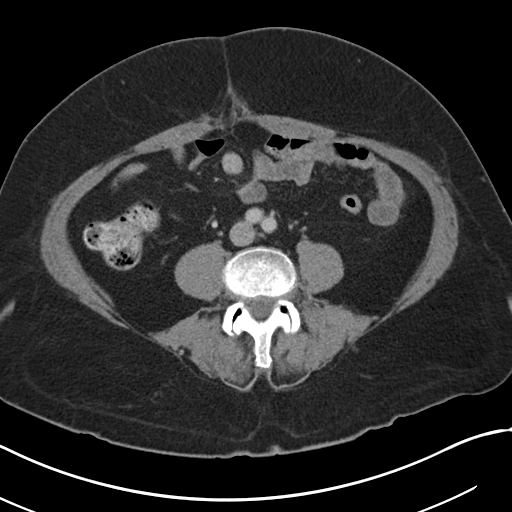
[im 51/101  soft-tissue]
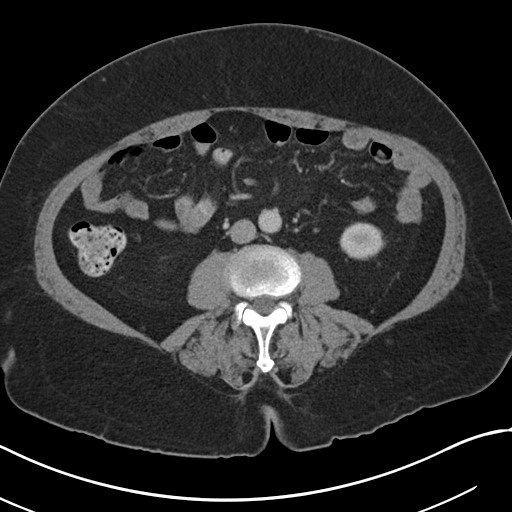
[im 56/101  soft-tissue]
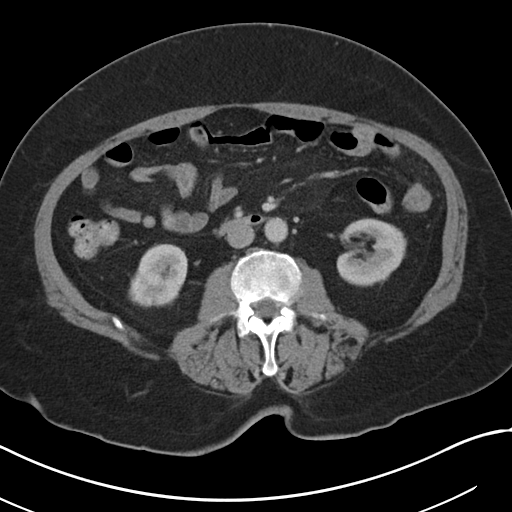
[im 67/101  soft-tissue]
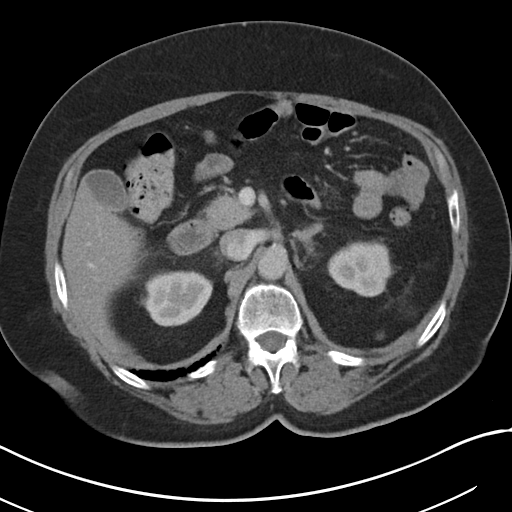
[im 67/101  bone]
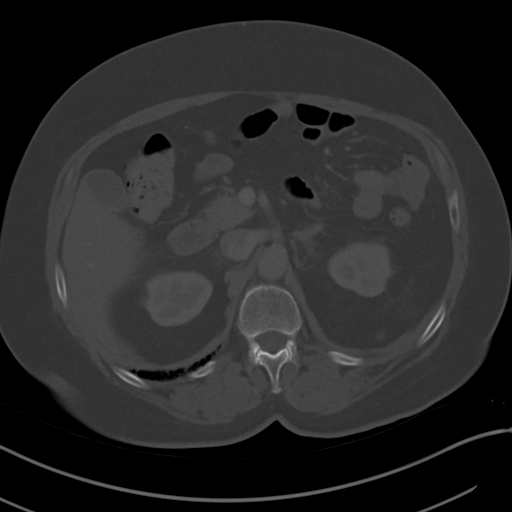
[im 73/101  soft-tissue]
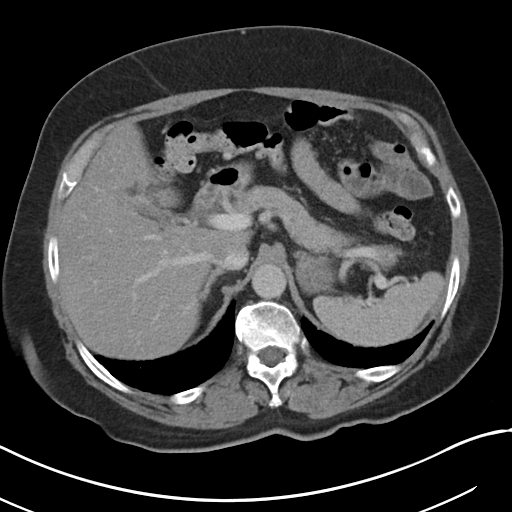
[im 78/101  soft-tissue]
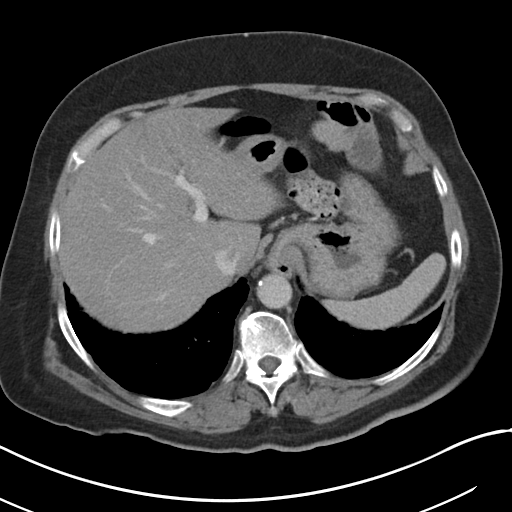
[im 89/101  soft-tissue]
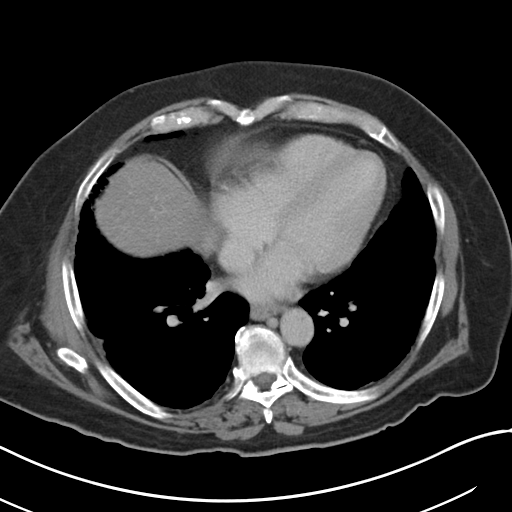
[im 95/101  soft-tissue]
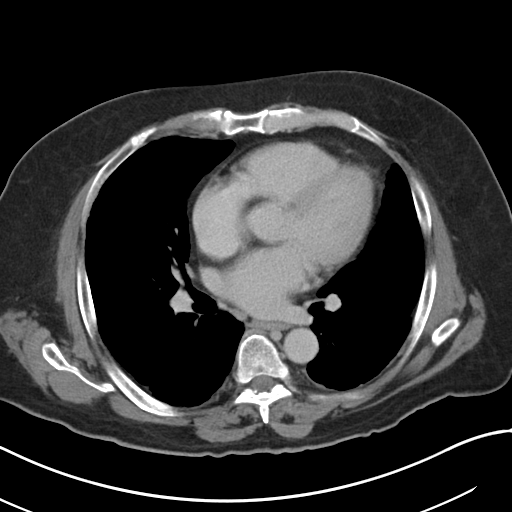

[Series 5: coronal soft tissue · coronal · 0.79mm/px · 3 of 93 slices shown]
[im 31/93  soft-tissue]
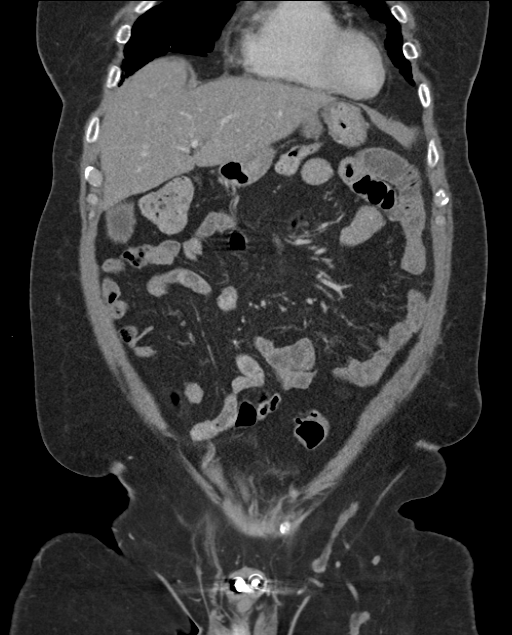
[im 41/93  soft-tissue]
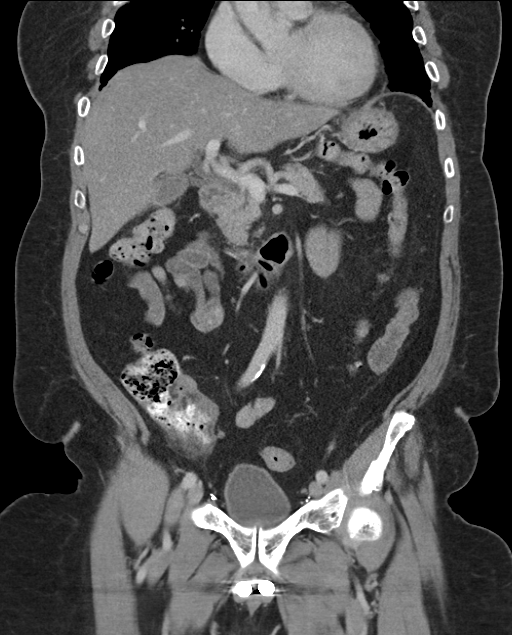
[im 52/93  soft-tissue]
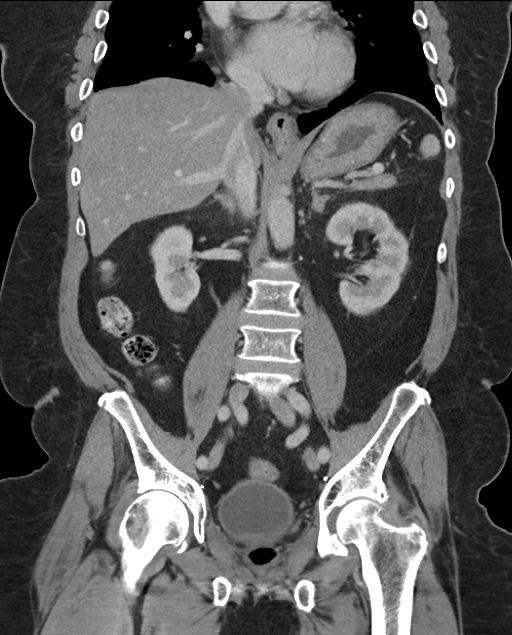

[16 of 46 positions shown; findings below may reference images not displayed]

FINDINGS: Lower chest: Coarse subpleural scarring and bronchiectasis in the
visualized lung bases. No pleural or pericardial effusion.

Hepatobiliary: No masses or other significant abnormality.

Pancreas: No mass, inflammatory changes, or other significant
abnormality.

Spleen: Within normal limits in size and appearance.

Adrenals/Urinary Tract: No masses identified. No evidence of
hydronephrosis. 13 mm left upper pole cyst stable.

Stomach/Bowel: Small hiatal hernia. Stomach, small bowel, and colon
are nondilated. Normal appendix. Scattered diverticula from the
descending and sigmoid segments of the colon, without adjacent
inflammatory/edematous change or abscess.

Vascular/Lymphatic: Patchy aortoiliac arterial calcifications
without aneurysm. Portal vein patent. No adenopathy localized.

Reproductive: Penile prosthesis component incompletely visualized.
Previous prostatectomy.

Other: No ascites.  No free air.

Musculoskeletal:  Facet DJD in the lower lumbar spine.
IMPRESSION: 1. No acute abdominal process.
2. Small hiatal hernia.
3. Descending and sigmoid diverticulosis.
4. Postop changes as above.

## 2017-11-10 IMAGING — CR DG ABDOMEN 2V
2 series · 2 of 2 positions shown · non-contrast
Comparison: CT 11/02/2015

CLINICAL DATA: LOW/MID ABDOMEN PAIN,NAUSEA FOR 3 DAYS,CONSTIPATION

EXAM:
ABDOMEN - 2 VIEW

[abdomen erect]
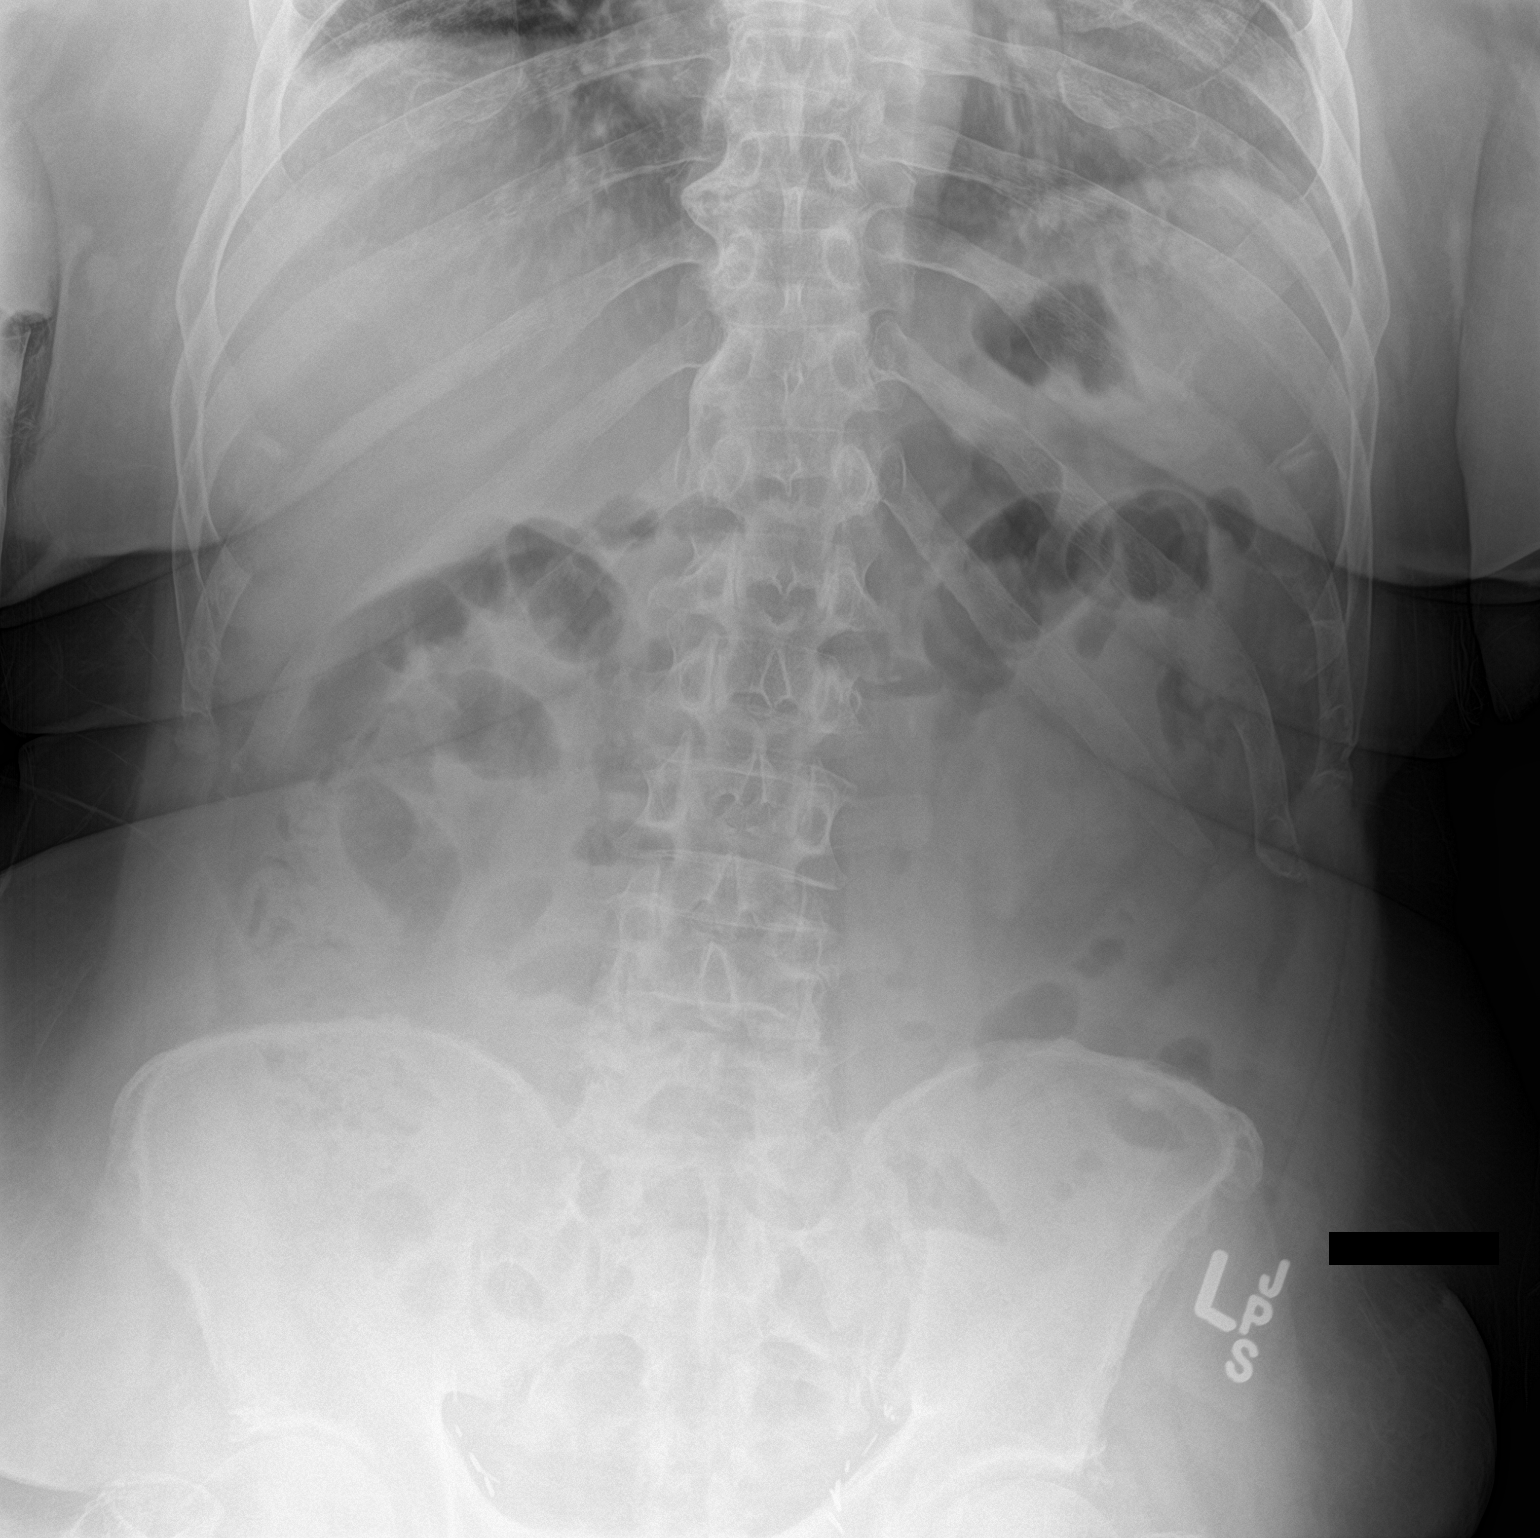

[abdomen supine]
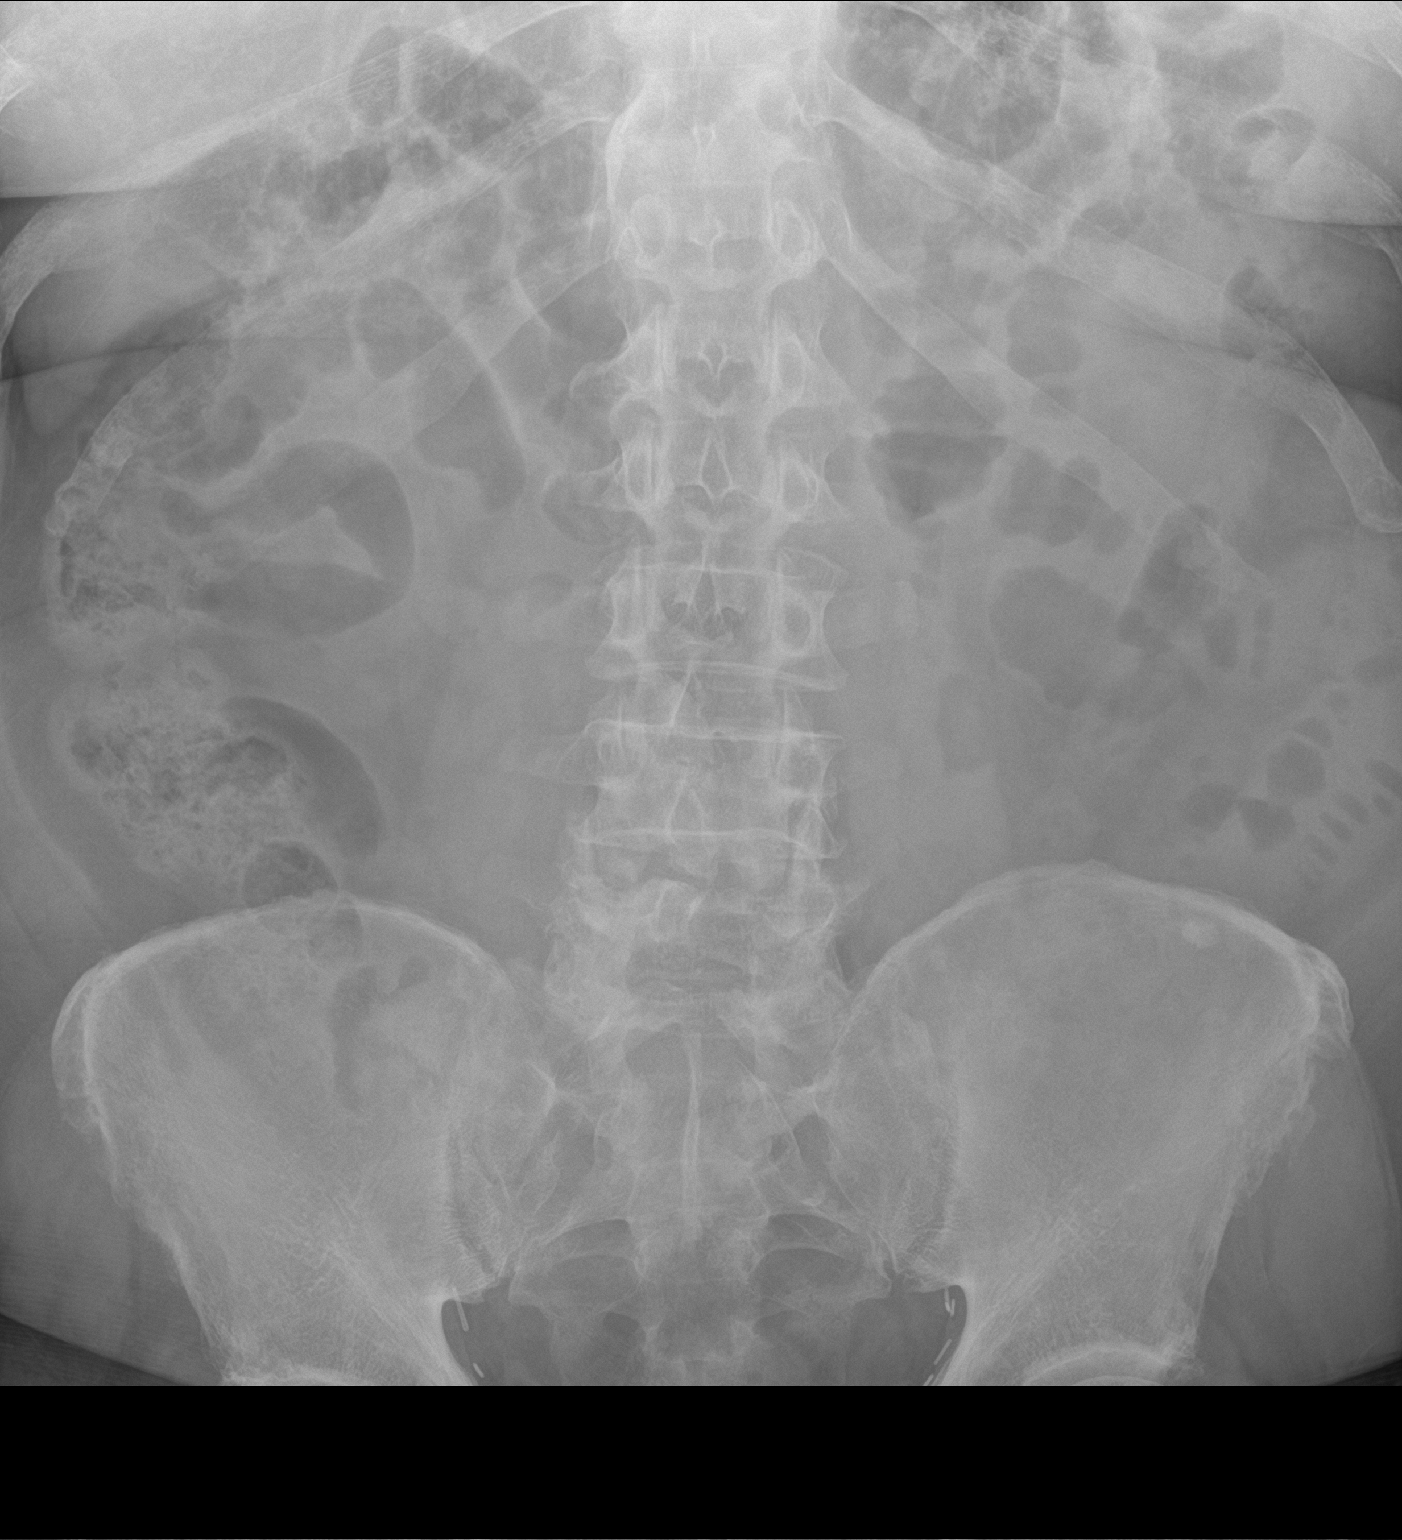

[2 of 2 positions shown; findings below may reference images not displayed]

FINDINGS: Visualized lung bases clear.

No free air.

Normal bowel gas pattern.

No abnormal abdominal calcifications. Surgical clips along bilateral
pelvic sidewalls.

Mild facet DJD in the lower lumbar spine.
IMPRESSION: Normal bowel gas pattern.

## 2017-12-05 DIAGNOSIS — E538 Deficiency of other specified B group vitamins: Secondary | ICD-10-CM | POA: Diagnosis not present

## 2017-12-29 DIAGNOSIS — C61 Malignant neoplasm of prostate: Secondary | ICD-10-CM | POA: Diagnosis not present

## 2018-01-08 DIAGNOSIS — N3281 Overactive bladder: Secondary | ICD-10-CM | POA: Diagnosis not present

## 2018-01-08 DIAGNOSIS — C61 Malignant neoplasm of prostate: Secondary | ICD-10-CM | POA: Diagnosis not present

## 2018-03-06 DIAGNOSIS — E538 Deficiency of other specified B group vitamins: Secondary | ICD-10-CM | POA: Diagnosis not present

## 2018-05-07 DIAGNOSIS — H401121 Primary open-angle glaucoma, left eye, mild stage: Secondary | ICD-10-CM | POA: Diagnosis not present

## 2018-05-07 DIAGNOSIS — H2513 Age-related nuclear cataract, bilateral: Secondary | ICD-10-CM | POA: Diagnosis not present

## 2018-05-07 DIAGNOSIS — H401213 Low-tension glaucoma, right eye, severe stage: Secondary | ICD-10-CM | POA: Diagnosis not present

## 2018-06-05 DIAGNOSIS — E538 Deficiency of other specified B group vitamins: Secondary | ICD-10-CM | POA: Diagnosis not present

## 2018-08-15 ENCOUNTER — Encounter (HOSPITAL_COMMUNITY): Payer: Self-pay

## 2018-08-15 ENCOUNTER — Ambulatory Visit (HOSPITAL_COMMUNITY)
Admission: EM | Admit: 2018-08-15 | Discharge: 2018-08-15 | Disposition: A | Discharge status: Home / Self Care | Payer: Medicare HMO | Attending: Internal Medicine | Admitting: Internal Medicine

## 2018-08-15 DIAGNOSIS — M109 Gout, unspecified: Secondary | ICD-10-CM | POA: Diagnosis not present

## 2018-08-15 MED ORDER — IBUPROFEN 400 MG PO TABS: 400.0000 mg | ORAL_TABLET | Freq: Four times a day (QID) | ORAL | 0 refills | Status: AC | PRN

## 2018-08-15 MED ORDER — PREDNISONE 50 MG PO TABS: 50.0000 mg | ORAL_TABLET | Freq: Every day | ORAL | 0 refills | Status: AC

## 2018-08-15 NOTE — ED Triage Notes (Signed)
Pt presents with pain, swelling and redness in right big toe since yesterday.

## 2018-08-15 NOTE — Discharge Instructions (Signed)
This appears to be gout to your toe.  Use of cane as needed to help with activity.  May try ace wrap, remove if uncomfortable. Elevate the foot, apply ice 3-4 times a day to help with swelling.  Drink plenty of water.  Prednisone 50mg  a day for 5 days. May take ibuprofen as needed after prednisone has been completed.  If develop worsening of pain, fevers, numbness, tingling, no improvement in the next 3 days or otherwise worsening please return or see your primary care provider.

## 2018-08-15 NOTE — ED Provider Notes (Signed)
Micheal Lyons    CSN: [id number] Arrival date & time: 08/15/18  1023     History   Chief Complaint Chief Complaint  Patient presents with  . Big Toe Pain    Right    HPI Micheal Lyons is a 77 y.o. male.   [name] presents with his wife with complaints of right great toe pain which started yesterday morning. No injury or trauma to the toe. Has become red and swollen. Tried soaking it this morning at around 0200 which made the pain worse. Has not taken any medications for pain. No history of gout or kidney stones. Denies recent alcohol intake, states he drinks water fairly regularly. No fevers. Pain 9/10. Has been using a cane for ambulation. Pain worse after weight bearing, improves as he bears weight. No history of Dm, no CKD. Hx of gerd, htn, prostate cancer, alcohol abuse.    ROS per HPI.      Past Medical History:  Diagnosis Date  . Aneurysm of infrarenal abdominal aorta (HCC)    Micheal Lyons 05/25/2009 (04/13/2013)  . Anginal pain ([place])    "that's why I'm here" (04/13/2013)  . Exertional shortness of breath    "sometimes" (04/13/2013)  . GERD (gastroesophageal reflux disease)   . High cholesterol   . Hypertension   . Prostate cancer [facility]) 1994    Patient Active Problem List   Diagnosis Date Noted  . Unspecified deficiency anemia 07/18/2014  . Pharyngitis 04/14/2013  . Prostate cancer ([place]) 04/13/2013  . Essential hypertension, benign 04/13/2013  . Chest pain, unspecified 04/13/2013  . Alcohol abuse 04/13/2013    Past Surgical History:  Procedure Laterality Date  . HERNIA REPAIR  1999   "in my stomach" (04/13/2013)  . PENILE PROSTHESIS IMPLANT  ? 1999  . PROSTATECTOMY  ~ 1994       Home Medications    Prior to Admission medications   Medication Sig Start Date End Date Taking? Authorizing Provider  aspirin EC 81 MG tablet Take 81 mg by mouth daily.    [provider]  Calcium Carbonate-Vitamin D (CALCIUM 600+D) 600-400 MG-UNIT per  tablet Take 2 tablets by mouth daily.     [provider]  Cyanocobalamin (VITAMIN B-12 IJ) Inject 1 each into the muscle every 3 (three) months. 1 injection IM q56months    [provider]  doxazosin (CARDURA) 8 MG tablet Take 4 mg by mouth daily.    [provider]  ibuprofen (ADVIL,MOTRIN) 400 MG tablet Take 1 tablet (400 mg total) by mouth every 6 (six) hours as needed. Once prednisone course completed 08/15/18   Augusto Gamble B, NP  lisinopril (PRINIVIL,ZESTRIL) 10 MG tablet Take 1 tablet (10 mg total) by mouth daily. 04/15/13   Janece Canterbury, MD  metoprolol tartrate (LOPRESSOR) 25 MG tablet Take 1 tablet (25 mg total) by mouth 2 (two) times daily. 04/15/13   Janece Canterbury, MD  Multiple Vitamins-Minerals (MULTIVITAMIN WITH MINERALS) tablet Take 1 tablet by mouth daily.    [provider]  omeprazole (PRILOSEC) 20 MG capsule Take 20 mg by mouth daily.  07/05/14   [provider]  oxybutynin (DITROPAN-XL) 10 MG 24 hr tablet Take 10 mg by mouth daily.    [provider]  phenol (CHLORASEPTIC) 1.4 % LIQD Use as directed 1 spray in the mouth or throat as needed. 04/15/13   Janece Canterbury, MD  predniSONE (DELTASONE) 50 MG tablet Take 1 tablet (50 mg total) by mouth daily with breakfast for 5  days. 08/15/18 08/20/18  Zigmund Gottron, NP  traMADol (ULTRAM) 50 MG tablet Take 1 tablet (50 mg total) by mouth every 6 (six) hours as needed. 04/27/17   Lajean Saver, MD    Family History History reviewed. No pertinent family history.  Social History Social History   Tobacco Use  . Smoking status: Never Smoker  . Smokeless tobacco: Current User    Types: Chew  Substance Use Topics  . Alcohol use: Yes    Alcohol/week: 56.0 standard drinks    Types: 56 Shots of liquor per week    Comment: 04/13/2013  1/2 of 1/5th bottle of brandy usually, sometimes whiskey/day"  . Drug use: No     Allergies   Patient has no known allergies.   Review of  Systems Review of Systems   Physical Exam Triage Vital Signs ED Triage Vitals [08/15/18 1128]  Enc Vitals Group     BP (!) 156/73     Pulse Rate 90     Resp 20     Temp 99.4 F (37.4 C)     Temp Source Oral     SpO2 100 %     Weight      Height      Head Circumference      Peak Flow      Pain Score      Pain Loc      Pain Edu?      Excl. in [place]?    No data found.  Updated Vital Signs BP (!) 156/73 (BP Location: Left Arm)   Pulse 90   Temp 99.4 F (37.4 C) (Oral)   Resp 20   SpO2 100%   Visual Acuity Right Lyons Distance:   Left Lyons Distance:   Bilateral Distance:    Right Lyons Near:   Left Lyons Near:    Bilateral Near:     Physical Exam  Constitutional: He is oriented to person, place, and time. He appears well-developed and well-nourished.  Cardiovascular: Normal rate and regular rhythm.  Pulmonary/Chest: Effort normal and breath sounds normal.  Musculoskeletal:       Right ankle: Normal.       Right foot: There is decreased range of motion, tenderness, bony tenderness and swelling. There is normal capillary refill, no crepitus, no deformity and no laceration.       Feet:  Pain redness and swelling at right foot great toe MTP joint; full ROM of toes but limited at MTP of great toe due to pain; no open skin, lesions, sores or source of infection; ankle WNL; strong pedal pulse; sensation intact; cap refill < 2 seconds ; ambulatory with cane   Neurological: He is alert and oriented to person, place, and time.  Skin: Skin is warm and dry.     UC Treatments / Results  Labs (all labs ordered are listed, but only abnormal results are displayed) Labs Reviewed - No data to display  EKG None  Radiology No results found.  Procedures Procedures (including critical care time)  Medications Ordered in UC Medications - No data to display  Initial Impression / Assessment and Plan / UC Course  I have reviewed the triage vital signs and the nursing  notes.  Pertinent labs & imaging results that were available during my care of the patient were reviewed by me and considered in my medical decision making (see chart for details).     No injury or trauma, sudden onset of great toe MTP joint pain with  redness and swelling. Imaging deferred. Appears consistent with gout. Prednisone provided. To be followed with ibuprofen prn. Return precautions provided. ACE wrap provided to see if compression provides pain control. Encouraged ice and elevation. Patient verbalized understanding and agreeable to plan.   Final Clinical Impressions(s) / UC Diagnoses   Final diagnoses:  Acute gout involving toe of right foot, unspecified cause     Discharge Instructions     This appears to be gout to your toe.  Use of cane as needed to help with activity.  May try ace wrap, remove if uncomfortable. Elevate the foot, apply ice 3-4 times a day to help with swelling.  Drink plenty of water.  Prednisone 50mg  a day for 5 days. May take ibuprofen as needed after prednisone has been completed.  If develop worsening of pain, fevers, numbness, tingling, no improvement in the next 3 days or otherwise worsening please return or see your primary care provider.    ED Prescriptions    Medication Sig Dispense Auth. Provider   predniSONE (DELTASONE) 50 MG tablet Take 1 tablet (50 mg total) by mouth daily with breakfast for 5 days. 5 tablet Augusto Gamble B, NP   ibuprofen (ADVIL,MOTRIN) 400 MG tablet Take 1 tablet (400 mg total) by mouth every 6 (six) hours as needed. Once prednisone course completed 30 tablet Zigmund Gottron, NP     Controlled Substance Prescriptions [place] Controlled Substance Registry consulted? Not Applicable   Zigmund Gottron, NP 08/15/18 1221

## 2018-08-26 DIAGNOSIS — Z23 Encounter for immunization: Secondary | ICD-10-CM | POA: Diagnosis not present

## 2018-09-03 DIAGNOSIS — M109 Gout, unspecified: Secondary | ICD-10-CM | POA: Diagnosis not present

## 2018-09-03 DIAGNOSIS — E669 Obesity, unspecified: Secondary | ICD-10-CM | POA: Diagnosis not present

## 2018-09-03 DIAGNOSIS — I10 Essential (primary) hypertension: Secondary | ICD-10-CM | POA: Diagnosis not present

## 2018-09-03 DIAGNOSIS — Z8546 Personal history of malignant neoplasm of prostate: Secondary | ICD-10-CM | POA: Diagnosis not present

## 2018-09-03 DIAGNOSIS — N3281 Overactive bladder: Secondary | ICD-10-CM | POA: Diagnosis not present

## 2018-09-03 DIAGNOSIS — E538 Deficiency of other specified B group vitamins: Secondary | ICD-10-CM | POA: Diagnosis not present

## 2018-09-03 DIAGNOSIS — R7309 Other abnormal glucose: Secondary | ICD-10-CM | POA: Diagnosis not present

## 2018-09-03 DIAGNOSIS — D510 Vitamin B12 deficiency anemia due to intrinsic factor deficiency: Secondary | ICD-10-CM | POA: Diagnosis not present

## 2018-09-09 DIAGNOSIS — H401213 Low-tension glaucoma, right eye, severe stage: Secondary | ICD-10-CM | POA: Diagnosis not present

## 2018-09-09 DIAGNOSIS — H2513 Age-related nuclear cataract, bilateral: Secondary | ICD-10-CM | POA: Diagnosis not present

## 2018-09-09 DIAGNOSIS — H401221 Low-tension glaucoma, left eye, mild stage: Secondary | ICD-10-CM | POA: Diagnosis not present

## 2018-11-08 DIAGNOSIS — I499 Cardiac arrhythmia, unspecified: Secondary | ICD-10-CM | POA: Diagnosis not present

## 2018-11-08 DIAGNOSIS — R0689 Other abnormalities of breathing: Secondary | ICD-10-CM | POA: Diagnosis not present

## 2018-11-08 DIAGNOSIS — R4020 Unspecified coma: Secondary | ICD-10-CM | POA: Diagnosis not present

## 2018-11-08 DIAGNOSIS — R404 Transient alteration of awareness: Secondary | ICD-10-CM | POA: Diagnosis not present

## 2018-11-25 DIAGNOSIS — 419620001 Death: Secondary | SNOMED CT | POA: Diagnosis not present

## 2018-11-25 DEATH — deceased
